# Patient Record
Sex: Female | Born: 1965
Health system: Southern US, Community
[De-identification: ages and names within clinical notes are randomized; demographics above are authoritative.]

## PROBLEM LIST (undated history)

## (undated) ENCOUNTER — Ambulatory Visit (HOSPITAL_COMMUNITY): Admission: EM | Payer: Self-pay | Source: Home / Self Care

## (undated) DIAGNOSIS — D649 Anemia, unspecified: Secondary | ICD-10-CM

## (undated) DIAGNOSIS — K219 Gastro-esophageal reflux disease without esophagitis: Secondary | ICD-10-CM

## (undated) HISTORY — DX: Anemia, unspecified: D64.9

## (undated) HISTORY — PX: TUBAL LIGATION: SHX77

---

## 1997-06-19 ENCOUNTER — Emergency Department (HOSPITAL_COMMUNITY): Admission: EM | Admit: 1997-06-19 | Discharge: 1997-06-19 | Payer: Self-pay | Admitting: Emergency Medicine

## 1998-11-25 ENCOUNTER — Emergency Department (HOSPITAL_COMMUNITY): Admission: EM | Admit: 1998-11-25 | Discharge: 1998-11-25 | Payer: Self-pay | Admitting: Emergency Medicine

## 2000-01-04 ENCOUNTER — Emergency Department (HOSPITAL_COMMUNITY): Admission: EM | Admit: 2000-01-04 | Discharge: 2000-01-04 | Payer: Self-pay | Admitting: Emergency Medicine

## 2004-04-13 ENCOUNTER — Emergency Department (HOSPITAL_COMMUNITY): Admission: EM | Admit: 2004-04-13 | Discharge: 2004-04-13 | Payer: Self-pay | Admitting: Emergency Medicine

## 2005-11-07 ENCOUNTER — Emergency Department (HOSPITAL_COMMUNITY): Admission: EM | Admit: 2005-11-07 | Discharge: 2005-11-07 | Payer: Self-pay | Admitting: Emergency Medicine

## 2007-01-18 ENCOUNTER — Emergency Department (HOSPITAL_COMMUNITY): Admission: EM | Admit: 2007-01-18 | Discharge: 2007-01-18 | Payer: Self-pay | Admitting: Emergency Medicine

## 2010-10-02 ENCOUNTER — Emergency Department (HOSPITAL_COMMUNITY)
Admission: EM | Admit: 2010-10-02 | Discharge: 2010-10-02 | Disposition: A | Discharge status: Home / Self Care | Payer: Medicaid Other | Attending: Emergency Medicine | Admitting: Emergency Medicine

## 2010-10-02 DIAGNOSIS — F411 Generalized anxiety disorder: Secondary | ICD-10-CM | POA: Insufficient documentation

## 2010-10-02 DIAGNOSIS — R51 Headache: Secondary | ICD-10-CM | POA: Insufficient documentation

## 2010-10-07 LAB — PREGNANCY, URINE: Preg Test, Ur: NEGATIVE

## 2010-10-07 LAB — URINALYSIS, ROUTINE W REFLEX MICROSCOPIC
Glucose, UA: NEGATIVE
Ketones, ur: NEGATIVE
Nitrite: NEGATIVE
Protein, ur: NEGATIVE
Urobilinogen, UA: 1

## 2010-10-07 LAB — GC/CHLAMYDIA PROBE AMP, GENITAL
Chlamydia, DNA Probe: NEGATIVE
GC Probe Amp, Genital: NEGATIVE

## 2010-10-07 LAB — URINE MICROSCOPIC-ADD ON

## 2010-10-07 LAB — WET PREP, GENITAL: Trich, Wet Prep: NONE SEEN

## 2010-10-07 LAB — RPR: RPR Ser Ql: NONREACTIVE

## 2012-10-24 ENCOUNTER — Emergency Department (HOSPITAL_COMMUNITY)
Admission: EM | Admit: 2012-10-24 | Discharge: 2012-10-24 | Disposition: A | Discharge status: Home / Self Care | Payer: Medicaid Other | Attending: Emergency Medicine | Admitting: Emergency Medicine

## 2012-10-24 ENCOUNTER — Encounter (HOSPITAL_COMMUNITY): Payer: Self-pay | Admitting: Emergency Medicine

## 2012-10-24 DIAGNOSIS — H9209 Otalgia, unspecified ear: Secondary | ICD-10-CM | POA: Insufficient documentation

## 2012-10-24 DIAGNOSIS — H60399 Other infective otitis externa, unspecified ear: Secondary | ICD-10-CM | POA: Insufficient documentation

## 2012-10-24 DIAGNOSIS — K089 Disorder of teeth and supporting structures, unspecified: Secondary | ICD-10-CM | POA: Insufficient documentation

## 2012-10-24 DIAGNOSIS — H6092 Unspecified otitis externa, left ear: Secondary | ICD-10-CM

## 2012-10-24 MED ORDER — ANTIPYRINE-BENZOCAINE 5.4-1.4 % OT SOLN
3.0000 [drp] | Freq: Once | OTIC | Status: AC
  Administered 2012-10-24: 4 [drp] via OTIC
  Filled 2012-10-24: qty 10

## 2012-10-24 MED ORDER — OFLOXACIN 0.3 % OT SOLN: 3.0000 [drp] | Freq: Two times a day (BID) | OTIC | Status: AC

## 2012-10-24 NOTE — ED Provider Notes (Signed)
CSN: [id number]     Arrival date & time 10/24/12  1332 History  This chart was scribed for non-physician practitioner Lindsey Bellman, PA-C working with Derwood Kaplan, MD by Valera Castle, ED scribe. This patient was seen in room WTR5/WTR5 and the patient's care was started at 1:57 PM.    Chief Complaint  Patient presents with  . Otalgia    The history is provided by the patient. No language interpreter was used.   HPI Comments: Lindsey Simpson is a 47 y.o. female brought in by EMS who presents to the Emergency Department complaining of gradual, mild, constant left ear pain, with a severity of 2/10, onset 2 months ago. She states that the pain has been increasing since onset, but denies any drainage from her ear. She reports the pain radiates into her left upper gum and she is concerned she is having dental pain. She reports taking Ibuprofen for the symptoms, with no relief. She denies swimming recently. She denies fever, SOB, rhinorrhea, congestion, sore throat, nausea, and any other associated symptoms. She reports occasional EtOH use, but denies smoking. She has no known allergies, and denies any prior medical history.    History reviewed. No pertinent past medical history. History reviewed. No pertinent past surgical history. History reviewed. No pertinent family history. History  Substance Use Topics  . Smoking status: Never Smoker   . Smokeless tobacco: Not on file  . Alcohol Use: Yes   OB History   Grav Para Term Preterm Abortions TAB SAB Ect Mult Living                 Review of Systems  Constitutional: Negative for fever.  HENT: Positive for dental problem (Left sided dental pain.) and ear pain (Left ear pain.). Negative for congestion, rhinorrhea and sore throat.   Respiratory: Negative for shortness of breath.   Gastrointestinal: Negative for nausea.  All other systems reviewed and are negative.    Allergies  Review of patient's allergies indicates no known  allergies.  Home Medications   Current Outpatient Rx  Name  Route  Sig  Dispense  Refill  . Aspirin-Acetaminophen-Caffeine (EXCEDRIN PO)   Oral   Take 2 tablets by mouth daily as needed (pain).         Marland Kitchen ibuprofen (ADVIL,MOTRIN) 200 MG tablet   Oral   Take 400 mg by mouth every 6 (six) hours as needed for pain.          Triage Vitals: BP 112/63  Pulse 79  Temp(Src) 98.2 F (36.8 C) (Oral)  Resp 12  SpO2 100%   Physical Exam  Nursing note and vitals reviewed. Constitutional: She is oriented to person, place, and time. She appears well-developed and well-nourished. No distress.  HENT:  Head: Normocephalic and atraumatic.  Right Ear: Tympanic membrane, external ear and ear canal normal. No mastoid tenderness.  Left Ear: Tympanic membrane normal. There is tenderness (tragal tenderness). No mastoid tenderness.  Nose: Nose normal.  Mouth/Throat: Oropharynx is clear and moist.  Mild edema of left ear canal. No TM injection. No trismus, submental edema, or tongue elevation. No tenderness to gum line.   Eyes: Conjunctivae are normal.  Neck: Normal range of motion.  Cardiovascular: Normal rate, regular rhythm and normal heart sounds.   Pulmonary/Chest: Effort normal and breath sounds normal. No stridor. No respiratory distress. She has no wheezes. She has no rales.  Abdominal: Soft. She exhibits no distension.  Musculoskeletal: Normal range of motion.  Neurological: She is  alert and oriented to person, place, and time. She has normal strength.  Skin: Skin is warm and dry. She is not diaphoretic. No erythema.  Psychiatric: She has a normal mood and affect. Her behavior is normal.    ED Course  Procedures (including critical care time)  DIAGNOSTIC STUDIES: Oxygen Saturation is 100% on room air, normal by my interpretation.    COORDINATION OF CARE: 2:00 PM-Discussed treatment plan which includes Auralgan with pt at bedside and pt agreed to plan. Advised pt of clinical  suspicion of external ear infection.     Labs Review Labs Reviewed - No data to display Imaging Review No results found.  MDM   1. Otitis externa, left    Pt presenting with otitis externa. No canal occlusion, Pt afebrile in NAD. Exam non concerning for mastoiditis, cellulitis or malignant OE. Dc with ofloxacin script.  Advised PCP follow up in 2-3 days if no improvement with treatment or no complete resolution by 7 days. Earlier f-u if pt develops rash , allergic reaction to medication, or loss of hearing.   I personally performed the services described in this documentation, which was scribed in my presence. The recorded information has been reviewed and is accurate.    Lindsey Bellman, PA-C 10/24/12 1826

## 2012-10-24 NOTE — ED Notes (Signed)
Per EMS, Pt c/o L ear pain x 2 months. Pain score 2/10.  Vitals are stable.

## 2012-10-29 NOTE — ED Provider Notes (Signed)
Medical screening examination/treatment/procedure(s) were performed by non-physician practitioner and as supervising physician I was immediately available for consultation/collaboration.  Manan Olmo, MD 10/29/12 0012 

## 2012-12-16 ENCOUNTER — Encounter (HOSPITAL_COMMUNITY): Payer: Self-pay | Admitting: Emergency Medicine

## 2012-12-16 ENCOUNTER — Emergency Department (HOSPITAL_COMMUNITY)
Admission: EM | Admit: 2012-12-16 | Discharge: 2012-12-16 | Disposition: A | Discharge status: Home / Self Care | Payer: Medicaid Other | Attending: Emergency Medicine | Admitting: Emergency Medicine

## 2012-12-16 DIAGNOSIS — H60399 Other infective otitis externa, unspecified ear: Secondary | ICD-10-CM | POA: Insufficient documentation

## 2012-12-16 DIAGNOSIS — H6092 Unspecified otitis externa, left ear: Secondary | ICD-10-CM

## 2012-12-16 DIAGNOSIS — R51 Headache: Secondary | ICD-10-CM | POA: Insufficient documentation

## 2012-12-16 MED ORDER — HYDROCORTISONE-ACETIC ACID 1-2 % OT SOLN: 4.0000 [drp] | Freq: Three times a day (TID) | OTIC | Status: DC

## 2012-12-16 MED ORDER — AMOXICILLIN 500 MG PO CAPS: 500.0000 mg | ORAL_CAPSULE | Freq: Three times a day (TID) | ORAL | Status: DC

## 2012-12-16 NOTE — ED Notes (Signed)
Pt from home reports that she was dx with swimmer's ear several months ago. Pt states that her L earache has gotten worse and it feels like her "brain is hurting" over her L eye and L temple. Pt denies N/V/D, fever, cough. Pt is A&O and in NAD

## 2012-12-16 NOTE — ED Notes (Signed)
Pt reports left ear pain x 2-3 months. Reports her "brain started hurting 1 month ago". Pain 9/10. Was seen in ED 10/8, given prescriptions, pt reports she lost prescriptions. Pt worried ear infection has spread to her brain.

## 2012-12-19 NOTE — ED Provider Notes (Signed)
CSN: [id number]     Arrival date & time 12/16/12  1322 History   First MD Initiated Contact with Patient 12/16/12 1337     Chief Complaint  Patient presents with  . Otalgia  . Headache   (Consider location/radiation/quality/duration/timing/severity/associated sxs/prior Treatment) HPI  47 year old female with left ear pain. Onset a couple months ago. Symptoms have waxed and waned but worsened within the past several days. She was evaluated about a month ago the emergency room for similar complaints. She denies fill her prescriptions. Returning today because she feels like infection may have spread to her brain. She does have a mild left-sided headache. No fevers or chills. No neck pain or neck stiffness. Has been taking NSAIDs occasionally with some mild relief. No history of diabetes.  History reviewed. No pertinent past medical history. History reviewed. No pertinent past surgical history. History reviewed. No pertinent family history. History  Substance Use Topics  . Smoking status: Never Smoker   . Smokeless tobacco: Not on file  . Alcohol Use: Yes   OB History   Grav Para Term Preterm Abortions TAB SAB Ect Mult Living                 Review of Systems  All systems reviewed and negative, other than as noted in HPI.   Allergies  Review of patient's allergies indicates no known allergies.  Home Medications   Current Outpatient Rx  Name  Route  Sig  Dispense  Refill  . ibuprofen (ADVIL,MOTRIN) 200 MG tablet   Oral   Take 800 mg by mouth every 6 (six) hours as needed for pain.          Marland Kitchen acetic acid-hydrocortisone (VOSOL-HC) otic solution   Left Ear   Place 4 drops into the left ear 3 (three) times daily.   10 mL   0   . amoxicillin (AMOXIL) 500 MG capsule   Oral   Take 1 capsule (500 mg total) by mouth 3 (three) times daily.   21 capsule   0    BP 102/71  Pulse 70  Temp(Src) 98.3 F (36.8 C) (Oral)  Resp 16  SpO2 99%  LMP 11/15/2012 Physical Exam   Nursing note and vitals reviewed. Constitutional: She appears well-developed and well-nourished. No distress.  HENT:  Head: Normocephalic and atraumatic.  Left ear pain with manipulation of the pinna. External auditory canal is somewhat edematous and inferior aspect looks raw. Thick milky discharge. No mastoid tenderness or overlying skin changes. No ear proptosis. Neck is supple. No nodes.  Eyes: Conjunctivae are normal. Right eye exhibits no discharge. Left eye exhibits no discharge.  Neck: Neck supple.  Cardiovascular: Normal rate, regular rhythm and normal heart sounds.  Exam reveals no gallop and no friction rub.   No murmur heard. Pulmonary/Chest: Effort normal and breath sounds normal. No respiratory distress.  Abdominal: Soft. She exhibits no distension. There is no tenderness.  Musculoskeletal: She exhibits no edema and no tenderness.  Neurological: She is alert.  Skin: Skin is warm and dry.  Psychiatric: She has a normal mood and affect. Her behavior is normal. Thought content normal.    ED Course  Procedures (including critical care time) Labs Review Labs Reviewed - No data to display Imaging Review No results found.  EKG Interpretation   None       MDM   1. Otitis externa, left    47 year old female with left ear pain. Patient is exam is consistent with otitis externa. She has  a lot of goop in he external auditory canal making it difficult to visualize her TM. She does not tolerate attempts to remove this very well. Patient was again provided with prescriptions for antibiotics. She should be able to afford these. She cannot afford the otic drops, discussed that she can use vinegar instead. Needs to establish a PCP. Return precautions were discussed. This appears to be a localized infection and no clinical evidence of mastoiditis or systemic illness.   Raeford Razor, MD 12/19/12 1816

## 2013-01-11 ENCOUNTER — Encounter (HOSPITAL_COMMUNITY): Payer: Self-pay | Admitting: Emergency Medicine

## 2013-01-11 ENCOUNTER — Emergency Department (HOSPITAL_COMMUNITY)
Admission: EM | Admit: 2013-01-11 | Discharge: 2013-01-12 | Disposition: A | Discharge status: Home / Self Care | Payer: Self-pay | Attending: Emergency Medicine | Admitting: Emergency Medicine

## 2013-01-11 DIAGNOSIS — H9209 Otalgia, unspecified ear: Secondary | ICD-10-CM | POA: Insufficient documentation

## 2013-01-11 DIAGNOSIS — H9202 Otalgia, left ear: Secondary | ICD-10-CM

## 2013-01-11 MED ORDER — MINERAL OIL PO OIL
TOPICAL_OIL | Freq: Once | ORAL | Status: DC
  Filled 2013-01-11: qty 30

## 2013-01-11 MED ORDER — ANTIPYRINE-BENZOCAINE 5.4-1.4 % OT SOLN
3.0000 [drp] | Freq: Once | OTIC | Status: AC
  Administered 2013-01-12: 4 [drp] via OTIC
  Filled 2013-01-11: qty 10

## 2013-01-11 MED ORDER — AMOXICILLIN 500 MG PO CAPS: 500.0000 mg | ORAL_CAPSULE | Freq: Three times a day (TID) | ORAL | Status: DC

## 2013-01-11 NOTE — ED Provider Notes (Signed)
CSN: [id number]     Arrival date & time 01/11/13  1513 History  This chart was scribed for non-physician practitioner Irish Elders, NP, working with Audree Camel, MD by Dorothey Baseman, ED Scribe. This patient was seen in room TR05C/TR05C and the patient's care was started at 7:55 PM.    Chief Complaint  Patient presents with  . Otalgia   The history is provided by the patient. No language interpreter was used.   HPI Comments: Lindsey Simpson is a 47 y.o. female who presents to the Emergency Department complaining of an intermittent pain to left ear that has been ongoing for the past few months. Patient was seen here on 10/24/2012 and 12/16/2012 for similar complaints and was discharged with antibiotics to treat otitis externa, which the patient did not have filled (as per the provider's note from 11/30). She denies fever, chills, congestion. Patient has no other pertinent medical history.   History reviewed. No pertinent past medical history. History reviewed. No pertinent past surgical history. History reviewed. No pertinent family history. History  Substance Use Topics  . Smoking status: Never Smoker   . Smokeless tobacco: Not on file  . Alcohol Use: Yes   OB History   Grav Para Term Preterm Abortions TAB SAB Ect Mult Living                 Review of Systems  Constitutional: Negative for fever and chills.  HENT: Positive for ear pain. Negative for congestion.   All other systems reviewed and are negative.    Allergies  Review of patient's allergies indicates no known allergies.  Home Medications   Current Outpatient Rx  Name  Route  Sig  Dispense  Refill  . ibuprofen (ADVIL,MOTRIN) 200 MG tablet   Oral   Take 800 mg by mouth every 6 (six) hours as needed for pain.          Marland Kitchen amoxicillin (AMOXIL) 500 MG capsule   Oral   Take 1 capsule (500 mg total) by mouth 3 (three) times daily.   21 capsule   0    Triage Vitals: BP 118/55  Pulse 80  Temp(Src) 98.5 F  (36.9 C) (Oral)  Resp 18  Ht 5\' 10"  (1.778 m)  Wt 132 lb (59.875 kg)  BMI 18.94 kg/m2  SpO2 100%  LMP 11/15/2012  Physical Exam  Nursing note and vitals reviewed. Constitutional: She is oriented to person, place, and time. She appears well-developed and well-nourished. No distress.  HENT:  Head: Normocephalic and atraumatic.  Right Ear: Hearing, tympanic membrane, external ear and ear canal normal.  Left Ear: Hearing normal.  Eyes: Conjunctivae are normal.  Neck: Normal range of motion. Neck supple.  Pulmonary/Chest: Effort normal. No respiratory distress.  Abdominal: She exhibits no distension.  Musculoskeletal: Normal range of motion.  Neurological: She is alert and oriented to person, place, and time.  Skin: Skin is warm and dry.  Psychiatric: She has a normal mood and affect. Her behavior is normal.    ED Course  Procedures (including critical care time)  DIAGNOSTIC STUDIES: Oxygen Saturation is 100% on room air, normal by my interpretation.    COORDINATION OF CARE: 7:57 PM- Will order liquid mineral oil to irrigate the ear. Discussed treatment plan with patient at bedside and patient verbalized agreement.   10:20 PM- Performed an irrigation of the ears. Both ears still appeared impacted. Will order Auralgan and discharge patient with antibiotics. Discussed treatment plan with patient at bedside and  patient verbalized agreement.     Labs Review Labs Reviewed - No data to display Imaging Review No results found.  EKG Interpretation   None       MDM   1. Ear pain, left    Afebrile. History of OM. Prescription for Amoxicillin given. Return precautions discussed. Take ibuprofen for discomfort. Pts ears irrigated, bilaterally due to wax impaction. Pt reports feeling better after ear wash and auralgan gtts.  I personally performed the services described in this documentation, which was scribed in my presence. The recorded information has been reviewed and is  accurate.       Irish Elders, NP 01/26/13 1520

## 2013-01-11 NOTE — ED Notes (Signed)
Pt here from with c/o left ear pain , pt was seen about 1 month ago for same problem ,

## 2013-01-29 NOTE — ED Provider Notes (Signed)
Medical screening examination/treatment/procedure(s) were performed by non-physician practitioner and as supervising physician I was immediately available for consultation/collaboration.  EKG Interpretation   None         Drae Mitzel T Demarcus Thielke, MD 01/29/13 0050 

## 2013-12-22 ENCOUNTER — Observation Stay (HOSPITAL_COMMUNITY): Payer: MEDICAID

## 2013-12-22 ENCOUNTER — Encounter (HOSPITAL_COMMUNITY): Payer: Self-pay | Admitting: Emergency Medicine

## 2013-12-22 ENCOUNTER — Observation Stay (HOSPITAL_COMMUNITY)
Admission: EM | Admit: 2013-12-22 | Discharge: 2013-12-23 | Disposition: A | Discharge status: Home / Self Care | Payer: Self-pay | Attending: Internal Medicine | Admitting: Internal Medicine

## 2013-12-22 DIAGNOSIS — N390 Urinary tract infection, site not specified: Secondary | ICD-10-CM | POA: Insufficient documentation

## 2013-12-22 DIAGNOSIS — N939 Abnormal uterine and vaginal bleeding, unspecified: Secondary | ICD-10-CM | POA: Diagnosis present

## 2013-12-22 DIAGNOSIS — N920 Excessive and frequent menstruation with regular cycle: Principal | ICD-10-CM | POA: Insufficient documentation

## 2013-12-22 DIAGNOSIS — T8351XA Infection and inflammatory reaction due to indwelling urinary catheter, initial encounter: Secondary | ICD-10-CM

## 2013-12-22 DIAGNOSIS — N921 Excessive and frequent menstruation with irregular cycle: Secondary | ICD-10-CM

## 2013-12-22 DIAGNOSIS — I959 Hypotension, unspecified: Secondary | ICD-10-CM | POA: Insufficient documentation

## 2013-12-22 DIAGNOSIS — D649 Anemia, unspecified: Secondary | ICD-10-CM | POA: Diagnosis present

## 2013-12-22 DIAGNOSIS — D500 Iron deficiency anemia secondary to blood loss (chronic): Secondary | ICD-10-CM | POA: Insufficient documentation

## 2013-12-22 LAB — CBC
HEMATOCRIT: 20.8 % — AB (ref 36.0–46.0)
Hemoglobin: 5.3 g/dL — CL (ref 12.0–15.0)
MCH: 15.6 pg — ABNORMAL LOW (ref 26.0–34.0)
MCHC: 25.5 g/dL — ABNORMAL LOW (ref 30.0–36.0)
MCV: 61.4 fL — AB (ref 78.0–100.0)
Platelets: 395 10*3/uL (ref 150–400)
RBC: 3.39 MIL/uL — AB (ref 3.87–5.11)
RDW: 19.1 % — ABNORMAL HIGH (ref 11.5–15.5)
WBC: 4.4 10*3/uL (ref 4.0–10.5)

## 2013-12-22 LAB — URINE MICROSCOPIC-ADD ON

## 2013-12-22 LAB — HIV ANTIBODY (ROUTINE TESTING W REFLEX): HIV 1&2 Ab, 4th Generation: NONREACTIVE

## 2013-12-22 LAB — URINALYSIS, ROUTINE W REFLEX MICROSCOPIC
BILIRUBIN URINE: NEGATIVE
Glucose, UA: NEGATIVE mg/dL
Ketones, ur: 15 mg/dL — AB
NITRITE: POSITIVE — AB
PH: 6 (ref 5.0–8.0)
Protein, ur: 30 mg/dL — AB
SPECIFIC GRAVITY, URINE: 1.019 (ref 1.005–1.030)
Urobilinogen, UA: 1 mg/dL (ref 0.0–1.0)

## 2013-12-22 LAB — BASIC METABOLIC PANEL
Anion gap: 11 (ref 5–15)
BUN: 17 mg/dL (ref 6–23)
CALCIUM: 8.7 mg/dL (ref 8.4–10.5)
CO2: 23 mEq/L (ref 19–32)
CREATININE: 0.7 mg/dL (ref 0.50–1.10)
Chloride: 103 mEq/L (ref 96–112)
GFR calc Af Amer: >90 mL/min (ref >90)
GLUCOSE: 85 mg/dL (ref 70–99)
Potassium: 3.8 mEq/L (ref 3.7–5.3)
SODIUM: 137 meq/L (ref 137–147)

## 2013-12-22 LAB — RPR

## 2013-12-22 LAB — HEMOGLOBIN AND HEMATOCRIT, BLOOD
HCT: 27.1 % — ABNORMAL LOW (ref 36.0–46.0)
Hemoglobin: 7.8 g/dL — ABNORMAL LOW (ref 12.0–15.0)

## 2013-12-22 LAB — TSH: TSH: 1.14 u[IU]/mL (ref 0.350–4.500)

## 2013-12-22 LAB — PROTIME-INR
INR: 1.08 (ref 0.00–1.49)
PROTHROMBIN TIME: 14.1 s (ref 11.6–15.2)

## 2013-12-22 LAB — WET PREP, GENITAL
Clue Cells Wet Prep HPF POC: NONE SEEN
TRICH WET PREP: NONE SEEN
YEAST WET PREP: NONE SEEN

## 2013-12-22 LAB — RETICULOCYTES
RBC.: 3.95 MIL/uL (ref 3.87–5.11)
Retic Count, Absolute: 15.8 10*3/uL — ABNORMAL LOW (ref 19.0–186.0)
Retic Ct Pct: 0.4 % (ref 0.4–3.1)

## 2013-12-22 LAB — ABO/RH: ABO/RH(D): O NEG

## 2013-12-22 LAB — POC URINE PREG, ED: Preg Test, Ur: NEGATIVE

## 2013-12-22 LAB — PREPARE RBC (CROSSMATCH)

## 2013-12-22 MED ORDER — FERROUS SULFATE 325 (65 FE) MG PO TABS
325.0000 mg | ORAL_TABLET | Freq: Two times a day (BID) | ORAL | Status: DC
  Administered 2013-12-22 – 2013-12-23 (×2): 325 mg via ORAL
  Filled 2013-12-22 (×5): qty 1

## 2013-12-22 MED ORDER — ACETAMINOPHEN 325 MG PO TABS
650.0000 mg | ORAL_TABLET | Freq: Four times a day (QID) | ORAL | Status: DC | PRN
  Administered 2013-12-23: 650 mg via ORAL
  Filled 2013-12-22: qty 2

## 2013-12-22 MED ORDER — ONDANSETRON HCL 4 MG/2ML IJ SOLN: 4.0000 mg | Freq: Four times a day (QID) | INTRAMUSCULAR | Status: DC | PRN

## 2013-12-22 MED ORDER — ESTROGENS CONJUGATED 25 MG IJ SOLR
25.0000 mg | Freq: Four times a day (QID) | INTRAMUSCULAR | Status: AC
  Administered 2013-12-22 (×2): 25 mg via INTRAVENOUS
  Filled 2013-12-22 (×3): qty 25

## 2013-12-22 MED ORDER — MEGESTROL ACETATE 40 MG PO TABS
120.0000 mg | ORAL_TABLET | Freq: Every day | ORAL | Status: DC
  Administered 2013-12-22 – 2013-12-23 (×2): 120 mg via ORAL
  Filled 2013-12-22 (×2): qty 3

## 2013-12-22 MED ORDER — DEXTROSE 5 % IV SOLN
1.0000 g | INTRAVENOUS | Status: DC
  Administered 2013-12-22: 1 g via INTRAVENOUS
  Filled 2013-12-22 (×2): qty 10

## 2013-12-22 MED ORDER — ONDANSETRON HCL 4 MG PO TABS: 4.0000 mg | ORAL_TABLET | Freq: Four times a day (QID) | ORAL | Status: DC | PRN

## 2013-12-22 MED ORDER — SODIUM CHLORIDE 0.9 % IJ SOLN
3.0000 mL | Freq: Two times a day (BID) | INTRAMUSCULAR | Status: DC
Start: 2013-12-22 — End: 2013-12-23
  Administered 2013-12-22: 3 mL via INTRAVENOUS

## 2013-12-22 MED ORDER — ACETAMINOPHEN 650 MG RE SUPP: 650.0000 mg | Freq: Four times a day (QID) | RECTAL | Status: DC | PRN

## 2013-12-22 MED ORDER — SODIUM CHLORIDE 0.9 % IV SOLN
Freq: Once | INTRAVENOUS | Status: AC
  Administered 2013-12-22: 11:00:00 via INTRAVENOUS

## 2013-12-22 MED ORDER — SODIUM CHLORIDE 0.9 % IV SOLN
1020.0000 mg | Freq: Once | INTRAVENOUS | Status: AC
  Administered 2013-12-22: 1020 mg via INTRAVENOUS
  Filled 2013-12-22: qty 34

## 2013-12-22 NOTE — ED Notes (Signed)
Critical lab of [facility] 5.3 per Jordan Hawks.

## 2013-12-22 NOTE — ED Notes (Signed)
Admitting MD at bedside.

## 2013-12-22 NOTE — ED Notes (Signed)
EMS - Patient coming from home with excessive vaginal bleeding since 12/10/13.  Per EMS the bleeding is so constant to the point "she needs one of her grand kids pampers".  Feels very fatigued and minor abdominal pain.

## 2013-12-22 NOTE — H&P (Signed)
History and Physical       Hospital Admission Note Date: 12/22/2013  Patient name: Lindsey Simpson Medical record number: [id number] Date of birth: December 26, 1965 Age: 48 y.o. Gender: female  PCP: No PCP Per Patient    Chief Complaint:  Heavy Vaginal bleeding with dizziness  HPI: Patient is a 48 year old female with no significant past medical history presented to ED with dizziness, lightheadedness, heavy vaginal bleeding. History was obtained from the patient, has not seen any PCP or OB/GYN in more than 10 years. Patient reported that she had normal menstrual period, last in 7 days in the  first week of November. Subsequently she restarted another menstrual period on November 24 which has been still going on, heavy bleeding. Patient also reports that she has been unable to afford sanitary napkins. She felt very weak, fatigued, dizzy and lightheaded. She also felt cold intolerance. Denies any dysuria, abdominal pain, fevers or chills. In the ED, hemoglobin found to be 5.3 patient was admitted for symptomatic anemia requiring blood transfusion.    Review of Systems:  Constitutional: Denies fever, chills, diaphoresis, poor appetite and + fatigue.  HEENT: Denies photophobia, eye pain, redness, hearing loss, ear pain, congestion, sore throat, rhinorrhea, sneezing, mouth sores, trouble swallowing, neck pain, neck stiffness and tinnitus.   Respiratory: Denies SOB, DOE, cough, chest tightness,  and wheezing.   Cardiovascular: Denies chest pain, palpitations and leg swelling.  Gastrointestinal: Denies nausea, vomiting, abdominal pain, diarrhea, constipation, blood in stool and abdominal distention.  Genitourinary: Denies dysuria, urgency, frequency, hematuria, flank pain and difficulty urinating.  Please see history of present illness Musculoskeletal: Denies myalgias, back pain, joint swelling, arthralgias and gait problem.  Skin: Denies pallor,  rash and wound.  Neurological: Denies seizures, syncope, numbness and headaches.  dizziness with lightheadedness + Hematological: Denies adenopathy. Easy bruising, personal or family bleeding history Psychiatric/Behavioral: Denies suicidal ideation, mood changes, confusion, nervousness, sleep disturbance and agitation  Past Medical History: History reviewed. No pertinent past medical history. Past Surgical History  Procedure Laterality Date  . Tubal ligation      Medications: Prior to Admission medications   Medication Sig Start Date End Date Taking? Authorizing Provider  ibuprofen (ADVIL,MOTRIN) 200 MG tablet Take 800 mg by mouth every 6 (six) hours as needed for pain.    Yes Historical Provider, MD  amoxicillin (AMOXIL) 500 MG capsule Take 1 capsule (500 mg total) by mouth 3 (three) times daily. Patient not taking: Reported on 12/22/2013 01/11/13   Elisha Headland, NP    Allergies:  No Known Allergies  Social History:  reports that she has never smoked. She does not have any smokeless tobacco history on file. She reports that she drinks alcohol. She reports that she does not use illicit drugs.  Family History: No family history on file.  Physical Exam: Blood pressure 109/62, pulse 61, temperature 98.5 F (36.9 C), temperature source Oral, resp. rate 16, height 5\' 11"  (1.803 m), weight 61.236 kg (135 lb), SpO2 100 %. General: Alert, awake, oriented x3, in no acute distress, pale conjunctiva . HEENT: normocephalic, atraumatic, anicteric sclera, pink conjunctiva, pupils equal and reactive to light and accomodation, oropharynx clear Neck: supple, no masses or lymphadenopathy, no goiter, no bruits  Heart: Regular rate and rhythm, without murmurs, rubs or gallops. Lungs: Clear to auscultation bilaterally, no wheezing, rales or rhonchi. Abdomen: Soft, nontender, nondistended, positive bowel sounds, no masses. Extremities: No clubbing, cyanosis or edema with positive pedal pulses. Neuro:  Grossly intact, no focal neurological deficits, strength 5/5  upper and lower extremities bilaterally Psych: alert and oriented x 3, normal mood and affect Skin: no rashes or lesions, warm and dry   LABS on Admission:  Basic Metabolic Panel:  Recent Labs Lab 12/22/13 1105  NA 137  K 3.8  CL 103  CO2 23  GLUCOSE 85  BUN 17  CREATININE 0.70  CALCIUM 8.7   Liver Function Tests: No results for input(s): AST, ALT, ALKPHOS, BILITOT, PROT, ALBUMIN in the last 168 hours. No results for input(s): LIPASE, AMYLASE in the last 168 hours. No results for input(s): AMMONIA in the last 168 hours. CBC:  Recent Labs Lab 12/22/13 0936  WBC 4.4  HGB 5.3*  HCT 20.8*  MCV 61.4*  PLT 395   Cardiac Enzymes: No results for input(s): CKTOTAL, CKMB, CKMBINDEX, TROPONINI in the last 168 hours. BNP: Invalid input(s): POCBNP CBG: No results for input(s): GLUCAP in the last 168 hours.   Radiological Exams on Admission: No results found.  Assessment/Plan Principal Problem:   Symptomatic anemia secondary to menorrhagia, microcytic anemia MCV 61.4 - Patient will be admitted for symptomatic anemia, transfuse 2 units packed RBC, recheck h/h after second transfusion, may need more transfusions - Patient also started on IV Ferriheme. EDP discussed with OB/GYN on call Dr. Darlis Loan, who recommended to start patient on Premarin once now and repeat dose in 6 hours, Megestrol 120 mg daily for 5 days, then taper to 80mg  daily until she is followed up at women's clinic - Pregnancy test negative, obtain pelvic ultrasound to rule out uterine fibroids, anemia panel, TSH - Place on iron supplementation  Active Problems:   Vaginal bleeding As #1     Hypotension - Currently stable - Likely due to #1, continue blood transfusion    UTI (urinary tract infection) - Obtain urine culture and sensitivities, placed on IV Rocephin   DVT prophylaxis:  SCDs   CODE STATUS:  full code  Family Communication:  Admission, patients condition and plan of care including tests being ordered have been discussed with the patient who indicates understanding and agree with the plan and Code Status   Further plan will depend as patient's clinical course evolves and further radiologic and laboratory data become available.   Time Spent on Admission: 1 hour  RAI,RIPUDEEP M.D. [facility] 12/22/2013, 1:19 PM Pager: [phone]  If 7PM-7AM, please contact night-coverage [email or web address] Password [email or web address]

## 2013-12-22 NOTE — Progress Notes (Signed)
MD notified that patient has a hgb of 7.8 after 2 units PRBCs transfused. No new orders to transfuse patient.  Will continue to monitor patient and see what CBC results come back in am.

## 2013-12-22 NOTE — Plan of Care (Signed)
Problem: Consults Goal: Skin Care Protocol Initiated - if Braden Score 18 or less If consults are not indicated, leave blank or document N/A  Outcome: Not Applicable Date Met:  33/00/76 Goal: Nutrition Consult-if indicated Outcome: Not Applicable Date Met:  22/63/33 Goal: Diabetes Guidelines if Diabetic/Glucose > 140 If diabetic or lab glucose is > 140 mg/dl - Initiate Diabetes/Hyperglycemia Guidelines & Document Interventions  Outcome: Not Applicable Date Met:  54/56/25  Problem: Phase I Progression Outcomes Goal: OOB as tolerated unless otherwise ordered Outcome: Completed/Met Date Met:  12/22/13 Goal: Initial discharge plan identified Outcome: Completed/Met Date Met:  12/22/13 Goal: Voiding-avoid urinary catheter unless indicated Outcome: Completed/Met Date Met:  12/22/13 Goal: Hemodynamically stable Outcome: Completed/Met Date Met:  12/22/13

## 2013-12-22 NOTE — ED Notes (Signed)
PA Harris at bedside for assessment.

## 2013-12-22 NOTE — ED Provider Notes (Signed)
CSN: [id number]     Arrival date & time 12/22/13  8921 History   First MD Initiated Contact with Patient 12/22/13 [phone]     Chief Complaint  Patient presents with  . Menorrhagia     (Consider location/radiation/quality/duration/timing/severity/associated sxs/prior Treatment) HPI  Lindsey Simpson Is a 48 year old African-American female who presents to emergency department with chief complaint of vaginal bleeding. She has a history of heavy menstrual cycles. She has no outpatient PCP and has not seen an OB/GYN specialist since she was pregnant, 18 years ago. The patient states that she had a normal period which lasted about 7 days at the beginning of November. Approximately one week later she had another full. Then beginning 12/10/2013 she began having her period again with very heavy bleeding. The patient states that she has been unable to afford sanitary napkins and has been using paper products and sometimes her grandchild's diaper. Patient states that she is bleeding heavily through all the products. She is using frequent. He has clinic change her clothes. She feels dizzy, lightheaded and weak. She endorses cold intolerance. She denies urinary symptoms or abdominal pain. She denies foul odor or vaginal discharge.  History reviewed. No pertinent past medical history. Past Surgical History  Procedure Laterality Date  . Tubal ligation     No family history on file. History  Substance Use Topics  . Smoking status: Never Smoker   . Smokeless tobacco: Not on file  . Alcohol Use: Yes     Comment: occassional   OB History    No data available     Review of Systems  Ten systems reviewed and are negative for acute change, except as noted in the HPI.    Allergies  Review of patient's allergies indicates no known allergies.  Home Medications   Prior to Admission medications   Medication Sig Start Date End Date Taking? Authorizing Provider  ibuprofen (ADVIL,MOTRIN) 200 MG tablet  Take 800 mg by mouth every 6 (six) hours as needed for pain.    Yes Historical Provider, MD  amoxicillin (AMOXIL) 500 MG capsule Take 1 capsule (500 mg total) by mouth 3 (three) times daily. Patient not taking: Reported on 12/22/2013 01/11/13   Elisha Headland, NP  ciprofloxacin (CIPRO) 500 MG tablet Take 1 tablet (500 mg total) by mouth 2 (two) times daily. 12/23/13   Bobby Rumpf York, PA-C  docusate sodium (COLACE) 100 MG capsule Take 1 capsule (100 mg total) by mouth 2 (two) times daily. 12/23/13   Melton Alar, PA-C  ferrous sulfate 325 (65 FE) MG tablet Take 1 tablet (325 mg total) by mouth 2 (two) times daily with a meal. 12/23/13   Melton Alar, PA-C  megestrol (MEGACE) 40 MG tablet Take 3 tabs daily for 3 days.  Then take 2 tabs daily until you see the gynecologist at [facility]. 12/23/13   Bobby Rumpf York, PA-C   BP 114/70 mmHg  Pulse 58  Temp(Src) 98.2 F (36.8 C) (Oral)  Resp 18  Ht 5\' 11"  (1.803 m)  Wt 135 lb (61.236 kg)  BMI 18.84 kg/m2  SpO2 100%  LMP  Physical Exam  Constitutional: She is oriented to person, place, and time. No distress.  Very thin, pale African-American female in no acute distress  HENT:  Head: Normocephalic and atraumatic.  Eyes: Conjunctivae are normal. No scleral icterus.  Neck: Normal range of motion.  Cardiovascular: Normal rate, regular rhythm and normal heart sounds.  Exam reveals no gallop and no friction  rub.   No murmur heard. Pulmonary/Chest: Effort normal and breath sounds normal. No respiratory distress.  Abdominal: Soft. Bowel sounds are normal. She exhibits no distension and no mass. There is no tenderness. There is no guarding.  Genitourinary:  Pelvic examination performed by myself. Patient has normal and vagina. Suction was utilized to remove copious bleeding. No clots present. I was able to visualize the cervix. Os is no lip breast and there are multiple nabothian cysts present. No cervical motion tenderness, no adnexal tenderness or  fullness. Uterus is palpably enlarged.  Neurological: She is alert and oriented to person, place, and time.  Skin: Skin is warm and dry. She is not diaphoretic.  Nursing note and vitals reviewed.   ED Course  Procedures (including critical care time) Labs Review Labs Reviewed  WET PREP, GENITAL - Abnormal; Notable for the following:    WBC, Wet Prep HPF POC FEW (*)    All other components within normal limits  CBC - Abnormal; Notable for the following:    RBC 3.39 (*)    Hemoglobin 5.3 (*)    HCT 20.8 (*)    MCV 61.4 (*)    MCH 15.6 (*)    MCHC 25.5 (*)    RDW 19.1 (*)    All other components within normal limits  URINALYSIS, ROUTINE W REFLEX MICROSCOPIC - Abnormal; Notable for the following:    Color, Urine BROWN (*)    APPearance TURBID (*)    Hgb urine dipstick LARGE (*)    Ketones, ur 15 (*)    Protein, ur 30 (*)    Nitrite POSITIVE (*)    Leukocytes, UA SMALL (*)    All other components within normal limits  URINE MICROSCOPIC-ADD ON - Abnormal; Notable for the following:    Squamous Epithelial / LPF FEW (*)    Bacteria, UA MANY (*)    All other components within normal limits  FERRITIN - Abnormal; Notable for the following:    Ferritin 5 (*)    All other components within normal limits  RETICULOCYTES - Abnormal; Notable for the following:    Retic Count, Manual 15.8 (*)    All other components within normal limits  CBC - Abnormal; Notable for the following:    Hemoglobin 8.2 (*)    HCT 27.4 (*)    MCV 65.4 (*)    MCH 19.6 (*)    MCHC 29.9 (*)    RDW 22.3 (*)    All other components within normal limits  HEMOGLOBIN AND HEMATOCRIT, BLOOD - Abnormal; Notable for the following:    Hemoglobin 7.8 (*)    HCT 27.1 (*)    All other components within normal limits  GC/CHLAMYDIA PROBE AMP  URINE CULTURE  HIV ANTIBODY (ROUTINE TESTING)  RPR  PROTIME-INR  BASIC METABOLIC PANEL  TSH  VITAMIN B12  FOLATE  BASIC METABOLIC PANEL  IRON AND TIBC  POC URINE PREG, ED   TYPE AND SCREEN  PREPARE RBC (CROSSMATCH)  ABO/RH    Imaging Review No results found.   EKG Interpretation None      MDM   Final diagnoses:  Symptomatic anemia  Menorrhagia with irregular cycle    9:52 AM BP 114/70 mmHg  Pulse 58  Temp(Src) 98.2 F (36.8 C) (Oral)  Resp 18  Ht 5\' 11"  (1.803 m)  Wt 135 lb (61.236 kg)  BMI 18.84 kg/m2  SpO2 100%  LMP  Patient here with a critically low hemoglobin of 5.3. MCV is also low  and this appears to be an acute on chronic exacerbation of her anemia. Spoke with Dr. Darlis Loan of the [facility] faculty practice. He gives the recommendation to begin the patient on IV Premarin once now and then a repeat dose in 6 hours. The patient is also to begin megestrol 120 mg by mouth as well as 1020 mg of ferriheme. The patient will also be transfused 2 units and require observation admission. At discharge, patient should continue her Megace at 120 mg for 5 days. She may then continue on 80 milligrams daily until she is followed up at the [facility] outpatient clinic for her bleeding.   Patient admitted by Dr. Tana Coast.  Pt stable in ED with no significant deterioration in condition. The patient appears reasonably stabilized for admission considering the current resources, flow, and capabilities available in the ED at this time, and I doubt any other [facility] requiring further screening and/or treatment in the ED prior to admission.   Margarita Mail, PA-C 12/23/13 [place], MD 12/24/13 [phone]

## 2013-12-23 ENCOUNTER — Observation Stay (HOSPITAL_COMMUNITY): Payer: MEDICAID

## 2013-12-23 LAB — FOLATE: FOLATE: 10.6 ng/mL

## 2013-12-23 LAB — TYPE AND SCREEN
ABO/RH(D): O NEG
Antibody Screen: NEGATIVE
UNIT DIVISION: 0
Unit division: 0

## 2013-12-23 LAB — GC/CHLAMYDIA PROBE AMP
CT PROBE, AMP APTIMA: NEGATIVE
GC Probe RNA: NEGATIVE

## 2013-12-23 LAB — IRON AND TIBC
IRON: 1039 ug/dL — AB (ref 42–135)
UIBC: <15 ug/dL — ABNORMAL LOW (ref 125–400)

## 2013-12-23 LAB — BASIC METABOLIC PANEL
Anion gap: 15 (ref 5–15)
BUN: 21 mg/dL (ref 6–23)
CHLORIDE: 103 meq/L (ref 96–112)
CO2: 20 meq/L (ref 19–32)
CREATININE: 0.59 mg/dL (ref 0.50–1.10)
Calcium: 8.7 mg/dL (ref 8.4–10.5)
GFR calc Af Amer: >90 mL/min (ref >90)
GFR calc non Af Amer: >90 mL/min (ref >90)
Glucose, Bld: 85 mg/dL (ref 70–99)
Potassium: 4.1 mEq/L (ref 3.7–5.3)
Sodium: 138 mEq/L (ref 137–147)

## 2013-12-23 LAB — FERRITIN: Ferritin: 5 ng/mL — ABNORMAL LOW (ref 10–291)

## 2013-12-23 LAB — CBC
HEMATOCRIT: 27.4 % — AB (ref 36.0–46.0)
HEMOGLOBIN: 8.2 g/dL — AB (ref 12.0–15.0)
MCH: 19.6 pg — ABNORMAL LOW (ref 26.0–34.0)
MCHC: 29.9 g/dL — AB (ref 30.0–36.0)
MCV: 65.4 fL — AB (ref 78.0–100.0)
Platelets: 337 10*3/uL (ref 150–400)
RBC: 4.19 MIL/uL (ref 3.87–5.11)
RDW: 22.3 % — ABNORMAL HIGH (ref 11.5–15.5)
WBC: 8.8 10*3/uL (ref 4.0–10.5)

## 2013-12-23 LAB — VITAMIN B12: Vitamin B-12: 359 pg/mL (ref 211–911)

## 2013-12-23 MED ORDER — CIPROFLOXACIN HCL 500 MG PO TABS
500.0000 mg | ORAL_TABLET | Freq: Two times a day (BID) | ORAL | Status: DC
  Administered 2013-12-23: 500 mg via ORAL
  Filled 2013-12-23 (×3): qty 1

## 2013-12-23 MED ORDER — MEGESTROL ACETATE 40 MG PO TABS: ORAL_TABLET | ORAL | Status: DC

## 2013-12-23 MED ORDER — MEGESTROL ACETATE 40 MG PO TABS: 120.0000 mg | ORAL_TABLET | Freq: Every day | ORAL | Status: DC

## 2013-12-23 MED ORDER — DOCUSATE SODIUM 100 MG PO CAPS: 100.0000 mg | ORAL_CAPSULE | Freq: Two times a day (BID) | ORAL | Status: DC

## 2013-12-23 MED ORDER — CIPROFLOXACIN HCL 500 MG PO TABS: 500.0000 mg | ORAL_TABLET | Freq: Two times a day (BID) | ORAL | Status: DC

## 2013-12-23 MED ORDER — MEGESTROL ACETATE 40 MG PO TABS
ORAL_TABLET | ORAL | Status: DC
Start: 2013-12-23 — End: 2015-01-19

## 2013-12-23 MED ORDER — FERROUS SULFATE 325 (65 FE) MG PO TABS: 325.0000 mg | ORAL_TABLET | Freq: Two times a day (BID) | ORAL | Status: DC

## 2013-12-23 NOTE — Discharge Summary (Signed)
Physician Discharge Summary  Lindsey Simpson YJE:[id number] DOB: 1965/10/13 DOA: 12/22/2013  PCP: No PCP Per Patient  Admit date: 12/22/2013 Discharge date: 12/23/2013  Time spent: 40 minutes   Recommendations for Outpatient Follow-up:  1. Follow Finalized Urine Culture - U/A positive.  Placed on Cipro.  Culture not ready at time of d/c. 2. Follow up at [facility] for vaginal bleeding.  Tx'd with 2 doses of premarin and high dose megace. 3. CBC in 3-5 days.   4. Pelvic Ultrasound completed during hospitalization.  Please check results.  Discharge Diagnoses:  Principal Problem:   Symptomatic anemia Active Problems:   Vaginal bleeding   Hypotension   UTI (urinary tract infection)   Discharge Condition: stable.  Diet recommendation: iron rich diet.  Filed Weights   12/22/13 0906  Weight: 61.236 kg (135 lb)    History of present illness:  Patient is a 48 year old female with no significant past medical history presented to ED with dizziness, lightheadedness, heavy vaginal bleeding. History was obtained from the patient, who reported she had not seen a PCP or OB/GYN in more than 10 years. Patient reported that she had a normal menstrual period, lasting 7 days in the first week of November. Subsequently she had another menstrual period on November 24 which is still going on (12/6) with heavy bleeding. Patient also reports that she has been unable to afford sanitary napkins. She felt very weak, fatigued, dizzy and lightheaded. She also felt cold intolerance. Denies any dysuria, abdominal pain, fevers or chills.  In the ED, hemoglobin found to be 5.3 patient was admitted for symptomatic anemia requiring blood transfusion.    Hospital Course:   Symptomatic anemia due to menorrhagia.  Hgb 5.3, MCV 61.4 on admission.  Received 2 units of PRBCs, along with IV iron.  Hgb 8.2 at the time of discharge.  The admitting physician spoke with Dr. Darlis Loan of gynecology who recommended  the patient received IV Premarin 2 doses, and Megace 120 mg daily for 5 days, then 80 mg daily until she is seen at [facility] clinic. This recommendation was followed and the patient was discharged on high dose Megace.  Pelvic ultrasound was completed inpatient and will follow up with Gyn as an outpatient   TSH was normal at 1.140  Urinary tract infection  The patient received 1 dose of IV Rocephin and will be discharged on oral Cipro   Urine cultures have not yet resulted at the time of discharge. Please check cultures on hospital follow-up  Hypotension  Resolved after blood transfusion and IV fluids.   Procedures:  Pelvic Ultrasound  Received 2 Units of PRBCs and IV iron.  Consultations:  Curb sided GYN  Discharge Exam: Filed Vitals:   12/23/13 0610  BP: 114/70  Pulse: 58  Temp: 98.2 F (36.8 C)  Resp: 18    General: Well-developed, thin, AA female, lying comfortably in bed, appears to have a lot of energy, boyfriend at bedside Cardiovascular: Regular rate and rhythm, no murmurs rubs or gallops appreciated Respiratory: Clear to auscultation no wheezes crackles or rales, no increased work of breathing Abdominal: Soft, nontender, nondistended, normal bowel sounds Extremities: No swelling cyanosis or edema, patient able to ambulate about the room  Discharge Instructions   Discharge Instructions    Diet - low sodium heart healthy    Complete by:  As directed      Diet - low sodium heart healthy    Complete by:  As directed  Increase activity slowly    Complete by:  As directed      Increase activity slowly    Complete by:  As directed           Current Discharge Medication List    START taking these medications   Details  ciprofloxacin (CIPRO) 500 MG tablet Take 1 tablet (500 mg total) by mouth 2 (two) times daily. Qty: 8 tablet, Refills: 0    docusate sodium (COLACE) 100 MG capsule Take 1 capsule (100 mg total) by mouth 2 (two) times daily. Qty: 60  capsule, Refills: 0    ferrous sulfate 325 (65 FE) MG tablet Take 1 tablet (325 mg total) by mouth 2 (two) times daily with a meal. Qty: 60 tablet, Refills: 3    megestrol (MEGACE) 40 MG tablet Take 3 tabs daily for 3 days.  Then take 2 tabs daily until you see the gynecologist at [facility]. Qty: 30 tablet, Refills: 0      STOP taking these medications     ibuprofen (ADVIL,MOTRIN) 200 MG tablet      amoxicillin (AMOXIL) 500 MG capsule        No Known Allergies Follow-up Information    Follow up with [place]. Schedule an appointment as soon as possible for a visit in 3 days.   Contact information:   [place] [phone]       The results of significant diagnostics from this hospitalization (including imaging, microbiology, ancillary and laboratory) are listed below for reference.    Significant Diagnostic Studies: No results found.  Microbiology: Recent Results (from the past 240 hour(s))  Wet prep, genital     Status: Abnormal   Collection Time: 12/22/13 10:45 AM  Result Value Ref Range Status   Yeast Wet Prep HPF POC NONE SEEN NONE SEEN Final   Trich, Wet Prep NONE SEEN NONE SEEN Final   Clue Cells Wet Prep HPF POC NONE SEEN NONE SEEN Final   WBC, Wet Prep HPF POC FEW (A) NONE SEEN Final     Labs: Basic Metabolic Panel:  Recent Labs Lab 12/22/13 1105 12/23/13 0526  NA 137 138  K 3.8 4.1  CL 103 103  CO2 23 20  GLUCOSE 85 85  BUN 17 21  CREATININE 0.70 0.59  CALCIUM 8.7 8.7   CBC:  Recent Labs Lab 12/22/13 0936 12/22/13 2030 12/23/13 0526  WBC 4.4  --  8.8  HGB 5.3* 7.8* 8.2*  HCT 20.8* 27.1* 27.4*  MCV 61.4*  --  65.4*  PLT 395  --  337       Signed:  YorkBobby Rumpf, PA-C  [facility] 12/23/2013, 10:25 AM      Patient seen and examined, chart and data base reviewed.  I agree with the above assessment and plan and have edited the above note  For full  details please see Mrs. Imogene Burn PA note.  Marzetta Board, MD [facility] [phone]

## 2013-12-23 NOTE — Care Management Note (Signed)
    Page 1 of 1   12/23/2013     7:53:44 PM CARE MANAGEMENT NOTE 12/23/2013  Patient:  Lindsey Simpson, Lindsey Simpson   Account Number:  [id number]  Date Initiated:  12/23/2013  Documentation initiated by:  Tomi Bamberger  Subjective/Objective Assessment:   dx vag bleeding  admit as observation- lives with spouse     Action/Plan:   Anticipated DC Date:  12/23/2013   Anticipated DC Plan:  [place]  CM consult  Follow-up appt scheduled      Choice offered to / List presented to:             Status of service:  Completed, signed off Medicare Important Message given?  NO (If response is "NO", the following Medicare IM given date fields will be blank) Date Medicare IM given:   Medicare IM given by:   Date Additional Medicare IM given:   Additional Medicare IM given by:    Discharge Disposition:  HOME/SELF CARE  Per UR Regulation:  Reviewed for med. necessity/level of care/duration of stay  If discussed at [place] of Stay Meetings, dates discussed:    Comments:  12/23/13 Tomi Bamberger RN, BSN [phone] patient for dc today, NCM gave patient information regarding [facility] clinic and medications.

## 2013-12-23 NOTE — Progress Notes (Signed)
Nsg Discharge Note  Admit Date:  12/22/2013 Discharge date: 12/23/2013   Lindsey Simpson to be D/C'd Home per MD order.  AVS completed.  Copy for chart, and copy for patient signed, and dated. Patient/caregiver able to verbalize understanding.  Discharge Medication:   Medication List    STOP taking these medications        amoxicillin 500 MG capsule  Commonly known as:  AMOXIL     ibuprofen 200 MG tablet  Commonly known as:  ADVIL,MOTRIN      TAKE these medications        ciprofloxacin 500 MG tablet  Commonly known as:  CIPRO  Take 1 tablet (500 mg total) by mouth 2 (two) times daily.     docusate sodium 100 MG capsule  Commonly known as:  COLACE  Take 1 capsule (100 mg total) by mouth 2 (two) times daily.     ferrous sulfate 325 (65 FE) MG tablet  Take 1 tablet (325 mg total) by mouth 2 (two) times daily with a meal.     megestrol 40 MG tablet  Commonly known as:  MEGACE  Take 3 tabs daily for 3 days.  Then take 2 tabs daily until you see the gynecologist at [facility].        Discharge Assessment: Filed Vitals:   12/23/13 0610  BP: 114/70  Pulse: 58  Temp: 98.2 F (36.8 C)  Resp: 18   Skin clean, dry and intact without evidence of skin break down, no evidence of skin tears noted. IV catheter discontinued intact. Site without signs and symptoms of complications - no redness or edema noted at insertion site, patient denies c/o pain - only slight tenderness at site.  Dressing with slight pressure applied.  D/c Instructions-Education: Discharge instructions given to patient/family with verbalized understanding. D/c education completed with patient/family including follow up instructions, medication list, d/c activities limitations if indicated, with other d/c instructions as indicated by MD - patient able to verbalize understanding, all questions fully answered. Patient instructed to return to ED, call 911, or call MD for any changes in condition.  Patient  escorted via volunteer services.  Dayle Points, RN 12/23/2013 12:56 PM

## 2013-12-23 NOTE — Plan of Care (Signed)
Problem: Phase II Progression Outcomes Goal: IV changed to normal saline lock Outcome: Completed/Met Date Met:  12/23/13     

## 2013-12-23 NOTE — Plan of Care (Signed)
Problem: Phase I Progression Outcomes Goal: Pain controlled with appropriate interventions Outcome: Completed/Met Date Met:  12/23/13     

## 2013-12-23 NOTE — Discharge Instructions (Signed)
Take stool softeners (Colace - ducosate sodium) while you are taking iron or you will become constipated.  It is extremely important that you follow up at the [facility] at [facility] with in the next 3-7 days.  You will need your blood work checked.    Take your antibiotic (Cipro) twice a day for 4 more days.

## 2013-12-24 LAB — URINE CULTURE: Colony Count: >=100000

## 2013-12-25 ENCOUNTER — Encounter: Payer: Self-pay | Admitting: Internal Medicine

## 2013-12-25 ENCOUNTER — Ambulatory Visit: Payer: Self-pay | Attending: Internal Medicine | Admitting: Internal Medicine

## 2013-12-25 ENCOUNTER — Encounter: Payer: Self-pay | Admitting: Obstetrics & Gynecology

## 2013-12-25 VITALS — BP 115/75 | HR 98 | Temp 97.9°F | Resp 16 | Ht 70.0 in | Wt 130.0 lb

## 2013-12-25 DIAGNOSIS — D250 Submucous leiomyoma of uterus: Secondary | ICD-10-CM

## 2013-12-25 DIAGNOSIS — N3000 Acute cystitis without hematuria: Secondary | ICD-10-CM

## 2013-12-25 DIAGNOSIS — D649 Anemia, unspecified: Secondary | ICD-10-CM

## 2013-12-25 MED ORDER — FERROUS SULFATE 325 (65 FE) MG PO TABS: 325.0000 mg | ORAL_TABLET | Freq: Two times a day (BID) | ORAL | Status: DC

## 2013-12-25 MED ORDER — SULFAMETHOXAZOLE-TRIMETHOPRIM 800-160 MG PO TABS: 1.0000 | ORAL_TABLET | Freq: Two times a day (BID) | ORAL | Status: DC

## 2013-12-25 NOTE — Patient Instructions (Signed)
Anemia, Nonspecific Anemia is a condition in which the concentration of red blood cells or hemoglobin in the blood is below normal. Hemoglobin is a substance in red blood cells that carries oxygen to the tissues of the body. Anemia results in not enough oxygen reaching these tissues.  CAUSES  Common causes of anemia include:   Excessive bleeding. Bleeding may be internal or external. This includes excessive bleeding from periods (in women) or from the intestine.   Poor nutrition.   Chronic kidney, thyroid, and liver disease.  Bone marrow disorders that decrease red blood cell production.  Cancer and treatments for cancer.  HIV, AIDS, and their treatments.  Spleen problems that increase red blood cell destruction.  Blood disorders.  Excess destruction of red blood cells due to infection, medicines, and autoimmune disorders. SIGNS AND SYMPTOMS   Minor weakness.   Dizziness.   Headache.  Palpitations.   Shortness of breath, especially with exercise.   Paleness.  Cold sensitivity.  Indigestion.  Nausea.  Difficulty sleeping.  Difficulty concentrating. Symptoms may occur suddenly or they may develop slowly.  DIAGNOSIS  Additional blood tests are often needed. These help your health care provider determine the best treatment. Your health care provider will check your stool for blood and look for other causes of blood loss.  TREATMENT  Treatment varies depending on the cause of the anemia. Treatment can include:   Supplements of iron, vitamin B12, or folic acid.   Hormone medicines.   A blood transfusion. This may be needed if blood loss is severe.   Hospitalization. This may be needed if there is significant continual blood loss.   Dietary changes.  Spleen removal. HOME CARE INSTRUCTIONS Keep all follow-up appointments. It often takes many weeks to correct anemia, and having your health care provider check on your condition and your response to  treatment is very important. SEEK IMMEDIATE MEDICAL CARE IF:   You develop extreme weakness, shortness of breath, or chest pain.   You become dizzy or have trouble concentrating.  You develop heavy vaginal bleeding.   You develop a rash.   You have bloody or black, tarry stools.   You faint.   You vomit up blood.   You vomit repeatedly.   You have abdominal pain.  You have a fever or persistent symptoms for more than 2-3 days.   You have a fever and your symptoms suddenly get worse.   You are dehydrated.  MAKE SURE YOU:  Understand these instructions.  Will watch your condition.  Will get help right away if you are not doing well or get worse. Document Released: 02/11/2004 Document Revised: 09/05/2012 Document Reviewed: 06/29/2012 Exit[employer] Patient Information 2015 [employer]. This information is not intended to replace advice given to you by your health care provider. Make sure you discuss any questions you have with your health care provider.  

## 2013-12-25 NOTE — Progress Notes (Signed)
HFU Pt was in the ED with heavy vaginal bleeding.  Pt was diagnosed with anemia.

## 2013-12-25 NOTE — Progress Notes (Signed)
Patient ID: Lindsey Simpson, female   DOB: [birth year], 48 y.o.   MRN: [id number]  EGB:[id number]  [id number]  DOB - 1965-12-07  CC:  Chief Complaint  Patient presents with  . Hospitalization Follow-up       HPI: Lindsey Simpson is a 48 y.o. female with no significant past medical history presented to the ED on 12/6 with dizziness, lightheadedness, heavy vaginal bleeding.  Patient reported that she had a normal menstrual period, lasting 7 days in the first week of November. Subsequently she had another menstrual period on November 24 which was still present on12/6 with heavy bleeding. She felt very weak, fatigued, dizzy and lightheaded. She also felt cold intolerance. Denies any dysuria, abdominal pain, fevers or chills. In the ED the patient was found to have a hemoglobin of 5.3, and was admitted at that time for symptomatic anemia requiring blood transfusion and IV iron.  Today she reports that she has continued the Megace to her hospital discharge but has not begun ferrous sulfate due to inability to pay for medication.  Patient has No headache, No chest pain, No abdominal pain - No Nausea, No new weakness tingling or numbness, No Cough - SOB.  No Known Allergies Past Medical History  Diagnosis Date  . Anemia    Current Outpatient Prescriptions on File Prior to Visit  Medication Sig Dispense Refill  . ciprofloxacin (CIPRO) 500 MG tablet Take 1 tablet (500 mg total) by mouth 2 (two) times daily. 8 tablet 0  . megestrol (MEGACE) 40 MG tablet Take 3 tabs daily for 3 days.  Then take 2 tabs daily until you see the gynecologist at [facility]. 60 tablet 0  . docusate sodium (COLACE) 100 MG capsule Take 1 capsule (100 mg total) by mouth 2 (two) times daily. (Patient not taking: Reported on 12/25/2013) 60 capsule 0  . ferrous sulfate 325 (65 FE) MG tablet Take 1 tablet (325 mg total) by mouth 2 (two) times daily with a meal. (Patient not taking: Reported on 12/25/2013) 60 tablet 3   No  current facility-administered medications on file prior to visit.   Family History  Problem Relation Age of Onset  . Cancer Mother   . Cancer Father   . Diabetes Father    History   Social History  . Marital Status: Single    Spouse Name: N/A    Number of Children: N/A  . Years of Education: N/A   Occupational History  . Not on file.   Social History Main Topics  . Smoking status: Never Smoker   . Smokeless tobacco: Not on file  . Alcohol Use: Yes     Comment: occassional  . Drug Use: No  . Sexual Activity: Not on file   Other Topics Concern  . Not on file   Social History Narrative    Review of Systems: Constitutional: Negative for fever, chills, diaphoresis, activity change, appetite change and fatigue. HENT: Negative for ear pain, nosebleeds, congestion, facial swelling, rhinorrhea, neck pain, neck stiffness and ear discharge.  Eyes: Negative for pain, discharge, redness, itching and visual disturbance. Respiratory: Negative for cough, choking, chest tightness, shortness of breath, wheezing and stridor.  Cardiovascular: Negative for chest pain, palpitations and leg swelling. Gastrointestinal: Negative for abdominal distention. Genitourinary: Negative for dysuria, urgency, frequency, hematuria, flank pain, decreased urine volume, difficulty urinating and dyspareunia.  Musculoskeletal: Negative for back pain, joint swelling, arthralgia and gait problem. Neurological: Negative for dizziness, tremors, seizures, syncope, facial asymmetry, speech difficulty, weakness, light-headedness, numbness  and headaches.  Hematological: Negative for adenopathy. Does not bruise/bleed easily. Psychiatric/Behavioral: Negative for hallucinations, behavioral problems, confusion, dysphoric mood, decreased concentration and agitation.    Objective:   Filed Vitals:   12/25/13 1001  BP: 115/75  Pulse: 98  Temp: 97.9 F (36.6 C)  Resp: 16    Physical Exam: Constitutional: Patient  appears well-developed and well-nourished. No distress. HENT: Normocephalic, atraumatic, External right and left ear normal. Oropharynx is clear and moist.  Eyes: Conjunctivae and EOM are normal. PERRLA, no scleral icterus. Neck: Normal ROM. Neck supple. No JVD. No tracheal deviation. No thyromegaly. CVS: RRR, S1/S2 +, no murmurs, no gallops, no carotid bruit.  Pulmonary: Effort and breath sounds normal, no stridor, rhonchi, wheezes, rales.  Abdominal: Soft. BS +, no distension, tenderness, rebound or guarding.  Musculoskeletal: Normal range of motion. No edema and no tenderness.  Lymphadenopathy: No lymphadenopathy noted, cervical, Neuro: Alert. Normal reflexes, muscle tone coordination. No cranial nerve deficit. Skin: Skin is warm and dry. No rash noted. Not diaphoretic. No erythema. No pallor. Psychiatric: Normal mood and affect. Behavior, judgment, thought content normal.  Lab Results  Component Value Date   WBC 8.8 12/23/2013   HGB 8.2* 12/23/2013   HCT 27.4* 12/23/2013   MCV 65.4* 12/23/2013   PLT 337 12/23/2013   Lab Results  Component Value Date   CREATININE 0.59 12/23/2013   BUN 21 12/23/2013   NA 138 12/23/2013   K 4.1 12/23/2013   CL 103 12/23/2013   CO2 20 12/23/2013    No results found for: HGBA1C Lipid Panel  No results found for: CHOL, TRIG, HDL, CHOLHDL, VLDL, LDLCALC     Assessment and plan:   Jeneal was seen today for hospitalization follow-up.  Diagnoses and associated orders for this visit:  Submucous leiomyoma of uterus - Ambulatory referral to Gynecology--for treatment options and to follow up on Megace use  Anemia, unspecified anemia type - ferrous sulfate 325 (65 FE) MG tablet; Take 1 tablet (325 mg total) by mouth 2 (two) times daily with a meal.--- Explained importance of use - CBC; Future  Acute cystitis without hematuria - sulfamethoxazole-trimethoprim (BACTRIM DS,SEPTRA DS) 800-160 MG per tablet; Take 1 tablet by mouth 2 (two) times  daily. Discontinue patient Cipro due to resistance, switch to Bactrim   Return for Friday for Lab Visit-CBC and 2 mo PCP.  The patient was given clear instructions to go to ER or return to medical center if symptoms don't improve, worsen or new problems develop. The patient verbalized understanding. The patient was told to call to get lab results if they haven't heard anything in the next week.     Chari Manning, [facility] [phone] 12/25/2013, 10:18 AM

## 2013-12-27 ENCOUNTER — Ambulatory Visit: Payer: Self-pay | Attending: Internal Medicine

## 2013-12-27 DIAGNOSIS — D649 Anemia, unspecified: Secondary | ICD-10-CM

## 2013-12-28 LAB — CBC
HCT: 32 % — ABNORMAL LOW (ref 36.0–46.0)
Hemoglobin: 9 g/dL — ABNORMAL LOW (ref 12.0–15.0)
MCH: 19.9 pg — ABNORMAL LOW (ref 26.0–34.0)
MCHC: 28.1 g/dL — ABNORMAL LOW (ref 30.0–36.0)
MCV: 70.6 fL — ABNORMAL LOW (ref 78.0–100.0)
MPV: 9.4 fL (ref 9.4–12.4)
Platelets: 438 K/uL — ABNORMAL HIGH (ref 150–400)
RBC: 4.53 MIL/uL (ref 3.87–5.11)
RDW: 27 % — ABNORMAL HIGH (ref 11.5–15.5)
WBC: 7.1 K/uL (ref 4.0–10.5)

## 2013-12-30 ENCOUNTER — Telehealth: Payer: Self-pay | Admitting: *Deleted

## 2013-12-30 DIAGNOSIS — D509 Iron deficiency anemia, unspecified: Secondary | ICD-10-CM

## 2013-12-30 NOTE — Telephone Encounter (Signed)
Left message on patient's VM to return call to discuss lab results Future order for CBC placed

## 2013-12-30 NOTE — Telephone Encounter (Signed)
-----   Message from Lance Bosch, NP sent at 12/29/2013 10:16 PM EST ----- Still significantly low hemoglobin.  Please stress importance of taking iron twice daily.  Will repeat cbc in one month, please place future order.

## 2013-12-31 ENCOUNTER — Telehealth: Payer: Self-pay | Admitting: Internal Medicine

## 2013-12-31 NOTE — Telephone Encounter (Signed)
Pt. Is returning nurses call to request blood work results, please f/u with pt. At [phone]

## 2014-01-01 ENCOUNTER — Telehealth: Payer: Self-pay | Admitting: Internal Medicine

## 2014-01-01 NOTE — Telephone Encounter (Signed)
Pt. Called to request blood work results, please f/u with pt. °

## 2014-01-07 ENCOUNTER — Encounter: Payer: Self-pay | Admitting: *Deleted

## 2014-01-07 ENCOUNTER — Telehealth: Payer: Self-pay | Admitting: Internal Medicine

## 2014-01-07 ENCOUNTER — Telehealth: Payer: Self-pay

## 2014-01-07 NOTE — Telephone Encounter (Signed)
Returned patient phone call Patient is aware of her lab results and that she should Be taking her iron pills twice daily. Patient had stated she was only Taking her medication once daily per instructions on the bottle Will start taking her iron BID

## 2014-01-07 NOTE — Telephone Encounter (Signed)
Pt. Called to request blood work results, please f/u with pt.

## 2014-01-07 NOTE — Progress Notes (Signed)
Pt is aware of her lab results.  

## 2014-02-05 ENCOUNTER — Encounter: Payer: Self-pay | Admitting: Obstetrics & Gynecology

## 2014-02-05 ENCOUNTER — Ambulatory Visit (INDEPENDENT_AMBULATORY_CARE_PROVIDER_SITE_OTHER): Payer: Self-pay | Admitting: Obstetrics & Gynecology

## 2014-02-05 VITALS — BP 99/77 | HR 74 | Temp 98.3°F | Wt 132.1 lb

## 2014-02-05 DIAGNOSIS — Z1151 Encounter for screening for human papillomavirus (HPV): Secondary | ICD-10-CM

## 2014-02-05 DIAGNOSIS — Z01419 Encounter for gynecological examination (general) (routine) without abnormal findings: Secondary | ICD-10-CM

## 2014-02-05 DIAGNOSIS — Z0000 Encounter for general adult medical examination without abnormal findings: Secondary | ICD-10-CM

## 2014-02-05 DIAGNOSIS — D500 Iron deficiency anemia secondary to blood loss (chronic): Secondary | ICD-10-CM

## 2014-02-05 DIAGNOSIS — Z124 Encounter for screening for malignant neoplasm of cervix: Secondary | ICD-10-CM

## 2014-02-05 DIAGNOSIS — D250 Submucous leiomyoma of uterus: Secondary | ICD-10-CM | POA: Insufficient documentation

## 2014-02-05 DIAGNOSIS — N920 Excessive and frequent menstruation with regular cycle: Secondary | ICD-10-CM

## 2014-02-05 NOTE — Progress Notes (Signed)
   Subjective:    Patient ID: Lindsey Simpson, female    DOB: 11/16/1965, 49 y.o.   MRN: [id number]  HPI  49 yo MAA P5 here for evaluation of menorrhagia, anemia (requiring transfusion) and a 6 cm partly submucosal fibroid.   Review of Systems     Objective:   Physical Exam  Thin anxious AA lady Breathing normally Abd- scaffoid Uterus enlarged but exam very difficult due to constant abdominal tensing by Lindsey Simpson      Assessment & Plan:  Preventative care- pap smear obtained Menorrhagia/fibroid- We discussed treatment options. She vehemently declines a hysterectomy. She is willing to have a depo provera injection until the time when she can get depot Lupron RTC q 3 months for depo provera She declines a flu vaccine.  Addendum: She does not have insurance and declines to incur the cost of the depo provera shot today so she will go to the health dept to get it there. She was also offered the opportunity to fill out the paperwork for free medicine.

## 2014-02-07 LAB — CYTOLOGY - PAP

## 2014-03-14 ENCOUNTER — Emergency Department (HOSPITAL_COMMUNITY)
Admission: EM | Admit: 2014-03-14 | Discharge: 2014-03-14 | Disposition: A | Discharge status: Home / Self Care | Payer: Self-pay | Attending: Emergency Medicine | Admitting: Emergency Medicine

## 2014-03-14 ENCOUNTER — Encounter (HOSPITAL_COMMUNITY): Payer: Self-pay | Admitting: Emergency Medicine

## 2014-03-14 DIAGNOSIS — R51 Headache: Secondary | ICD-10-CM | POA: Insufficient documentation

## 2014-03-14 DIAGNOSIS — R519 Headache, unspecified: Secondary | ICD-10-CM

## 2014-03-14 DIAGNOSIS — Z792 Long term (current) use of antibiotics: Secondary | ICD-10-CM | POA: Insufficient documentation

## 2014-03-14 DIAGNOSIS — Z79899 Other long term (current) drug therapy: Secondary | ICD-10-CM | POA: Insufficient documentation

## 2014-03-14 DIAGNOSIS — D649 Anemia, unspecified: Secondary | ICD-10-CM | POA: Insufficient documentation

## 2014-03-14 DIAGNOSIS — Z8679 Personal history of other diseases of the circulatory system: Secondary | ICD-10-CM | POA: Insufficient documentation

## 2014-03-14 MED ORDER — BUTALBITAL-APAP-CAFFEINE 50-325-40 MG PO TABS: 1.0000 | ORAL_TABLET | Freq: Four times a day (QID) | ORAL | Status: AC | PRN

## 2014-03-14 NOTE — ED Notes (Signed)
Pt c/o migraine intermittently x 2 months; pt sts took ibuprofen with decrease in pain today

## 2014-03-14 NOTE — ED Provider Notes (Signed)
CSN: [id number]     Arrival date & time 03/14/14  1709 History  This chart was scribed for Domenic Moras, PA-C working Everlene Balls, MD by Randa Evens, ED Scribe. This patient was seen in room A11C/A11C and the patient's care was started at 11:19 PM.      Chief Complaint  Patient presents with  . Headache   Patient is a 49 y.o. female presenting with headaches. The history is provided by the patient. No language interpreter was used.  Headache Associated symptoms: no dizziness, no fever, no nausea, no neck stiffness and no vomiting    HPI Comments: Lindsey Simpson is a 49 y.o. female with PMHx of migraines who presents to the Emergency Department complaining of recurrent acute onset of improving stabbing migraine HA onset today. Pt states that she has been having intermittent HA's for the past 2 months. Pt states that HA is located in right temporal. Pt states she has some intermittent blurred vision. Pt states that she has taken 400 mg of ibuprofen that has provided relief. Pt states that sleeping sometimes provides relief. Denies fever, neck stiffness, nausea, vomiting, light headedness, weight change, night sweats or dizziness. Pt states that this HA feels like previous HA's. Pt states that she had similar HA for several years ago that resolved with an injection. Denies Hx of cancer  Past Medical History  Diagnosis Date  . Anemia    Past Surgical History  Procedure Laterality Date  . Tubal ligation     Family History  Problem Relation Age of Onset  . Cancer Mother   . Cancer Father   . Diabetes Father    History  Substance Use Topics  . Smoking status: Never Smoker   . Smokeless tobacco: Never Used  . Alcohol Use: Yes     Comment: occassional   OB History    Gravida Para Term Preterm AB TAB SAB Ectopic Multiple Living   6 5 5  0 1 0 1 0 0 5     Review of Systems  Constitutional: Negative for fever and appetite change.  Gastrointestinal: Negative for nausea and vomiting.   Musculoskeletal: Negative for neck stiffness.  Neurological: Positive for headaches. Negative for dizziness and light-headedness.     Allergies  Review of patient's allergies indicates no known allergies.  Home Medications   Prior to Admission medications   Medication Sig Start Date End Date Taking? Authorizing Provider  docusate sodium (COLACE) 100 MG capsule Take 1 capsule (100 mg total) by mouth 2 (two) times daily. Patient not taking: Reported on 12/25/2013 12/23/13   Melton Alar, PA-C  ferrous sulfate 325 (65 FE) MG tablet Take 1 tablet (325 mg total) by mouth 2 (two) times daily with a meal. 12/25/13   Lance Bosch, NP  megestrol (MEGACE) 40 MG tablet Take 3 tabs daily for 3 days.  Then take 2 tabs daily until you see the gynecologist at [facility]. Patient not taking: Reported on 02/05/2014 12/23/13   Caren Griffins, MD  sulfamethoxazole-trimethoprim (BACTRIM DS,SEPTRA DS) 800-160 MG per tablet Take 1 tablet by mouth 2 (two) times daily. Patient not taking: Reported on 02/05/2014 12/25/13   Lance Bosch, NP   BP 118/65 mmHg  Pulse 58  Temp(Src) 97.9 F (36.6 C) (Oral)  Resp 16  SpO2 100%   Physical Exam  Constitutional: She is oriented to person, place, and time. She appears well-developed and well-nourished. No distress.  HENT:  Head: Normocephalic and atraumatic.  Eyes: Conjunctivae  and EOM are normal. Pupils are equal, round, and reactive to light.  Neck: Neck supple. No tracheal deviation present.  No nuchal rigidity  Cardiovascular: Normal rate.   Pulmonary/Chest: Effort normal. No respiratory distress.  Musculoskeletal: Normal range of motion.  Neurological: She is alert and oriented to person, place, and time. She has normal strength. No cranial nerve deficit or sensory deficit. She displays a negative Romberg sign. Coordination and gait normal. GCS eye subscore is 4. GCS verbal subscore is 5. GCS motor subscore is 6.  Skin: Skin is warm and dry.   Psychiatric: She has a normal mood and affect. Her behavior is normal.  Nursing note and vitals reviewed.   ED Course  Procedures (including critical care time) DIAGNOSTIC STUDIES: Oxygen Saturation is 100% on RA, normal by my interpretation.    COORDINATION OF CARE: 11:32 PM-Discussed treatment plan with pt at bedside and pt agreed to plan.   11:35 PM Headache similar to previous, no fever, neck stiffness, neuro findings or new symptoms to suggest more serious etiology.  I don't think SAH, ICH, meningitis, encephalitis, mass at this time.  No recent trauma.  I don't feel imaging necessary at this time.  Headache is essentially resolved without treatment.      Labs Review Labs Reviewed - No data to display  Imaging Review No results found.   EKG Interpretation None      MDM   Final diagnoses:  Bad headache   BP 123/76 mmHg  Pulse 69  Temp(Src) 97.9 F (36.6 C) (Oral)  Resp 16  SpO2 100%   I personally performed the services described in this documentation, which was scribed in my presence. The recorded information has been reviewed and is accurate.       Domenic Moras, PA-C 03/14/14 2336  Wandra Arthurs, MD 03/15/14 [phone]

## 2014-03-14 NOTE — Discharge Instructions (Signed)

## 2014-04-28 ENCOUNTER — Ambulatory Visit: Payer: Self-pay

## 2015-01-19 ENCOUNTER — Emergency Department (HOSPITAL_COMMUNITY)
Admission: EM | Admit: 2015-01-19 | Discharge: 2015-01-19 | Disposition: A | Discharge status: Home / Self Care | Payer: Medicaid Other | Attending: Emergency Medicine | Admitting: Emergency Medicine

## 2015-01-19 ENCOUNTER — Encounter (HOSPITAL_COMMUNITY): Payer: Self-pay

## 2015-01-19 DIAGNOSIS — Z79899 Other long term (current) drug therapy: Secondary | ICD-10-CM | POA: Insufficient documentation

## 2015-01-19 DIAGNOSIS — N939 Abnormal uterine and vaginal bleeding, unspecified: Secondary | ICD-10-CM | POA: Insufficient documentation

## 2015-01-19 DIAGNOSIS — Z3202 Encounter for pregnancy test, result negative: Secondary | ICD-10-CM | POA: Diagnosis not present

## 2015-01-19 DIAGNOSIS — D649 Anemia, unspecified: Secondary | ICD-10-CM

## 2015-01-19 LAB — URINALYSIS, ROUTINE W REFLEX MICROSCOPIC
Bilirubin Urine: NEGATIVE
Glucose, UA: NEGATIVE mg/dL
Ketones, ur: 15 mg/dL — AB
NITRITE: NEGATIVE
Protein, ur: >300 mg/dL — AB
Specific Gravity, Urine: 1.024 (ref 1.005–1.030)
pH: 7.5 (ref 5.0–8.0)

## 2015-01-19 LAB — URINE MICROSCOPIC-ADD ON

## 2015-01-19 LAB — PROTIME-INR
INR: 1.06 (ref 0.00–1.49)
Prothrombin Time: 14 seconds (ref 11.6–15.2)

## 2015-01-19 LAB — CBC
HEMATOCRIT: 24.3 % — AB (ref 36.0–46.0)
Hemoglobin: 7 g/dL — ABNORMAL LOW (ref 12.0–15.0)
MCH: 19.6 pg — AB (ref 26.0–34.0)
MCHC: 28.8 g/dL — AB (ref 30.0–36.0)
MCV: 67.9 fL — ABNORMAL LOW (ref 78.0–100.0)
PLATELETS: 344 10*3/uL (ref 150–400)
RBC: 3.58 MIL/uL — ABNORMAL LOW (ref 3.87–5.11)
RDW: 17 % — ABNORMAL HIGH (ref 11.5–15.5)
WBC: 5.1 10*3/uL (ref 4.0–10.5)

## 2015-01-19 LAB — BASIC METABOLIC PANEL
Anion gap: 7 (ref 5–15)
BUN: 19 mg/dL (ref 6–20)
CHLORIDE: 103 mmol/L (ref 101–111)
CO2: 28 mmol/L (ref 22–32)
CREATININE: 0.74 mg/dL (ref 0.44–1.00)
Calcium: 8.9 mg/dL (ref 8.9–10.3)
GFR calc Af Amer: >60 mL/min (ref >60)
GFR calc non Af Amer: >60 mL/min (ref >60)
GLUCOSE: 104 mg/dL — AB (ref 65–99)
POTASSIUM: 3.8 mmol/L (ref 3.5–5.1)
Sodium: 138 mmol/L (ref 135–145)

## 2015-01-19 LAB — I-STAT BETA HCG BLOOD, ED (MC, WL, AP ONLY): I-stat hCG, quantitative: <5 m[IU]/mL (ref <5)

## 2015-01-19 LAB — APTT: aPTT: 32 seconds (ref 24–37)

## 2015-01-19 MED ORDER — MEGESTROL ACETATE 40 MG PO TABS: 80.0000 mg | ORAL_TABLET | Freq: Two times a day (BID) | ORAL | Status: DC

## 2015-01-19 MED ORDER — MEGESTROL ACETATE 40 MG PO TABS
80.0000 mg | ORAL_TABLET | Freq: Every day | ORAL | Status: DC
  Administered 2015-01-19: 80 mg via ORAL
  Filled 2015-01-19: qty 2

## 2015-01-19 NOTE — ED Notes (Signed)
Spouse has possession of personal property

## 2015-01-19 NOTE — ED Notes (Signed)
Pt ambulated to restroom and urinated in specimen cup, but dumped it out and stated there was too much blood. Advised patient to keep next sample for analysis regardless of appearance.

## 2015-01-19 NOTE — Discharge Instructions (Signed)

## 2015-01-19 NOTE — ED Notes (Signed)
Patient reports that she has had heavy vaginal bleeding with clots x 2 weeks, reports that she feels weak due to same. Alert and oriented, no distress

## 2015-01-19 NOTE — ED Provider Notes (Addendum)
CSN: [id number]     Arrival date & time 01/19/15  74 History   First MD Initiated Contact with Patient 01/19/15 1900     Chief Complaint  Patient presents with  . Weakness  . Vaginal Bleeding     (Consider location/radiation/quality/duration/timing/severity/associated sxs/prior Treatment) HPI  Patient is a 50 year old female, she has a history of anemia and heavy vaginal bleeding, she had a submucosal leiomyoma that was thought to be the culprit, in December 2015 after having a blood transfusion for severe symptomatic anemia, she was given several recommendations including possible hysterectomy and other medication-related solutions and she declined those, she did not seek repeat evaluation, she did not seek medical insurance and has not been back to the Medicaid office in over a year. She states that over the last couple of weeks she has had heavy bleeding including daily heavy clots, she does not wear pads or tampons but states that she folds up tissues and places them in her pants and, sometimes wears a diaper. She denies abdominal pain, denies back pain, denies dysuria or diarrhea or rectal bleeding. She does not get lightheaded short of breath and has no generalized weakness or focal weakness. She denies any symptoms of orthostasis. She does not take birth control pills  Past Medical History  Diagnosis Date  . Anemia    Past Surgical History  Procedure Laterality Date  . Tubal ligation     Family History  Problem Relation Age of Onset  . Cancer Mother   . Cancer Father   . Diabetes Father    Social History  Substance Use Topics  . Smoking status: Never Smoker   . Smokeless tobacco: Never Used  . Alcohol Use: Yes     Comment: occassional   OB History    Gravida Para Term Preterm AB TAB SAB Ectopic Multiple Living   6 5 5  0 1 0 1 0 0 5     Review of Systems  All other systems reviewed and are negative.     Allergies  Review of patient's allergies indicates no known  allergies.  Home Medications   Prior to Admission medications   Medication Sig Start Date End Date Taking? Authorizing Provider  butalbital-acetaminophen-caffeine (FIORICET) [phone] MG per tablet Take 1-2 tablets by mouth every 6 (six) hours as needed for headache. 03/14/14 03/14/15  Domenic Moras, PA-C  docusate sodium (COLACE) 100 MG capsule Take 1 capsule (100 mg total) by mouth 2 (two) times daily. Patient not taking: Reported on 12/25/2013 12/23/13   Melton Alar, PA-C  ferrous sulfate 325 (65 FE) MG tablet Take 1 tablet (325 mg total) by mouth 2 (two) times daily with a meal. 12/25/13   Lance Bosch, NP  megestrol (MEGACE) 40 MG tablet Take 2 tablets (80 mg total) by mouth 2 (two) times daily. Take twice daily until bleeding stops - consult with your Gynecologist before starting this medicine 01/19/15   Noemi Chapel, MD   BP 105/78 mmHg  Pulse 69  Temp(Src) 98 F (36.7 C) (Oral)  Resp 18  Ht 5\' 10"  (1.778 m)  Wt 130 lb (58.968 kg)  BMI 18.65 kg/m2  SpO2 96%  LMP 01/06/2015 Physical Exam  Constitutional: She appears well-developed and well-nourished. No distress.  HENT:  Head: Normocephalic and atraumatic.  Mouth/Throat: Oropharynx is clear and moist. No oropharyngeal exudate.  Eyes: Conjunctivae and EOM are normal. Pupils are equal, round, and reactive to light. Right eye exhibits no discharge. Left eye exhibits no discharge. No  scleral icterus.  Neck: Normal range of motion. Neck supple. No JVD present. No thyromegaly present.  Cardiovascular: Normal rate, regular rhythm, normal heart sounds and intact distal pulses.  Exam reveals no gallop and no friction rub.   No murmur heard. Pulmonary/Chest: Effort normal and breath sounds normal. No respiratory distress. She has no wheezes. She has no rales.  Abdominal: Soft. Bowel sounds are normal. She exhibits no distension and no mass. There is no tenderness.  Genitourinary:  Chaperone present for exam,large amount of blood in vault -  no ttp  Musculoskeletal: Normal range of motion. She exhibits no edema or tenderness.  Lymphadenopathy:    She has no cervical adenopathy.  Neurological: She is alert. Coordination normal.  Skin: Skin is warm and dry. No rash noted. No erythema.  Psychiatric: She has a normal mood and affect. Her behavior is normal.  Nursing note and vitals reviewed.   ED Course  Procedures (including critical care time) Labs Review Labs Reviewed  BASIC METABOLIC PANEL - Abnormal; Notable for the following:    Glucose, Bld 104 (*)    All other components within normal limits  CBC - Abnormal; Notable for the following:    RBC 3.58 (*)    Hemoglobin 7.0 (*)    HCT 24.3 (*)    MCV 67.9 (*)    MCH 19.6 (*)    MCHC 28.8 (*)    RDW 17.0 (*)    All other components within normal limits  URINALYSIS, ROUTINE W REFLEX MICROSCOPIC (NOT AT [facility]) - Abnormal; Notable for the following:    Color, Urine RED (*)    APPearance TURBID (*)    Hgb urine dipstick LARGE (*)    Ketones, ur 15 (*)    Protein, ur >300 (*)    Leukocytes, UA SMALL (*)    All other components within normal limits  URINE MICROSCOPIC-ADD ON - Abnormal; Notable for the following:    Squamous Epithelial / LPF 6-30 (*)    Bacteria, UA MANY (*)    All other components within normal limits  WET PREP, GENITAL  PROTIME-INR  APTT  I-STAT BETA HCG BLOOD, ED ([facility], [facility], [facility] ONLY)  GC/CHLAMYDIA PROBE AMP ([place]) NOT AT [facility]    Imaging Review No results found. I have personally reviewed and evaluated these images and lab results as part of my medical decision-making.    MDM   Final diagnoses:  Vaginal bleeding  Anemia, unspecified anemia type    D/w Dr. Nehemiah Settle who will arrange outpt f/u - recommended megace - pt informed and is in agreement.  Pt agreeable - is not symptomatic from anemia - stable appearing.   Noemi Chapel, MD 01/19/15 [id number]  Noemi Chapel, MD 01/20/15 [phone]

## 2015-01-21 MED FILL — MEGESTROL 40 MG TABLET: 40 | 8 days supply | Qty: 30 | Fill #0

## 2015-01-22 ENCOUNTER — Encounter: Payer: Self-pay | Admitting: Family Medicine

## 2015-01-27 ENCOUNTER — Ambulatory Visit: Payer: Self-pay | Admitting: Internal Medicine

## 2015-02-11 MED FILL — MEGESTROL 40 MG TABLET: 40 | 8 days supply | Qty: 30 | Fill #1

## 2015-02-13 ENCOUNTER — Ambulatory Visit: Payer: Self-pay | Admitting: Internal Medicine

## 2015-04-02 ENCOUNTER — Ambulatory Visit: Payer: Self-pay | Admitting: Obstetrics & Gynecology

## 2016-11-28 ENCOUNTER — Emergency Department (HOSPITAL_COMMUNITY)
Admission: EM | Admit: 2016-11-28 | Discharge: 2016-11-28 | Disposition: A | Discharge status: Home / Self Care | Payer: Medicaid Other | Attending: Emergency Medicine | Admitting: Emergency Medicine

## 2016-11-28 ENCOUNTER — Other Ambulatory Visit: Payer: Self-pay

## 2016-11-28 DIAGNOSIS — K0889 Other specified disorders of teeth and supporting structures: Secondary | ICD-10-CM | POA: Diagnosis present

## 2016-11-28 DIAGNOSIS — Z79899 Other long term (current) drug therapy: Secondary | ICD-10-CM | POA: Insufficient documentation

## 2016-11-28 DIAGNOSIS — D649 Anemia, unspecified: Secondary | ICD-10-CM | POA: Insufficient documentation

## 2016-11-28 MED ORDER — IBUPROFEN 800 MG PO TABS: 800.0000 mg | ORAL_TABLET | Freq: Three times a day (TID) | ORAL | 0 refills | Status: DC

## 2016-11-28 MED ORDER — IBUPROFEN 800 MG PO TABS
800.0000 mg | ORAL_TABLET | Freq: Once | ORAL | Status: AC
  Administered 2016-11-28: 800 mg via ORAL
  Filled 2016-11-28: qty 1

## 2016-11-28 NOTE — Discharge Instructions (Signed)
Please read attached information. If you experience any new or worsening signs or symptoms please return to the emergency room for evaluation. Please follow-up with your primary care provider or specialist as discussed. Please use medication prescribed only as directed and discontinue taking if you have any concerning signs or symptoms.   °

## 2016-11-28 NOTE — ED Provider Notes (Signed)
[facility] DEPT Provider Note   CSN: [id number] Arrival date & time: 11/28/16  1416     History   Chief Complaint Chief Complaint  Patient presents with  . Dental Pain    HPI Lindsey Simpson is a 51 y.o. female.  HPI   51 year old female presents today with complaints of dental pain.  Patient notes a significant past medical history of the same.  She notes all of her teeth have problems, noting that her left lower dentition has been hurting over the last several days.  She notes this is normal for the patient, but reports this has sustained longer than usual.  She notes radiation to her ear again this is normal.  She denies any swelling or edema, denies any discharge.  Denies any fever.  No other complaints here today.  No significant upper respiratory complaints.  Past Medical History:  Diagnosis Date  . Anemia     Patient Active Problem List   Diagnosis Date Noted  . Fibroids, submucosal 02/05/2014  . Symptomatic anemia 12/22/2013  . Vaginal bleeding 12/22/2013  . Hypotension 12/22/2013  . UTI (urinary tract infection) 12/22/2013    Past Surgical History:  Procedure Laterality Date  . TUBAL LIGATION      OB History    Gravida Para Term Preterm AB Living   6 5 5  0 1 5   SAB TAB Ectopic Multiple Live Births   1 0 0 0         Home Medications    Prior to Admission medications   Medication Sig Start Date End Date Taking? Authorizing Provider  docusate sodium (COLACE) 100 MG capsule Take 1 capsule (100 mg total) by mouth 2 (two) times daily. Patient not taking: Reported on 12/25/2013 12/23/13   Dellinger, Bobby Rumpf, PA-C  ferrous sulfate 325 (65 FE) MG tablet Take 1 tablet (325 mg total) by mouth 2 (two) times daily with a meal. 12/25/13   Lance Bosch, NP  ibuprofen (ADVIL,MOTRIN) 800 MG tablet Take 1 tablet (800 mg total) 3 (three) times daily by mouth. 11/28/16   Kal Chait, Dellis Filbert, PA-C  megestrol (MEGACE) 40 MG tablet Take 2  tablets (80 mg total) by mouth 2 (two) times daily. Take twice daily until bleeding stops - consult with your Gynecologist before starting this medicine 01/19/15   Noemi Chapel, MD    Family History Family History  Problem Relation Age of Onset  . Cancer Mother   . Cancer Father   . Diabetes Father     Social History Social History   Tobacco Use  . Smoking status: Never Smoker  . Smokeless tobacco: Never Used  Substance Use Topics  . Alcohol use: Yes    Comment: occassional  . Drug use: No     Allergies   Patient has no known allergies.   Review of Systems Review of Systems  All other systems reviewed and are negative.    Physical Exam Updated Vital Signs BP (!) 142/78 (BP Location: Left Arm)   Pulse 70   Temp 98.2 F (36.8 C) (Oral)   Resp 18   Ht 5\' 10"  (1.778 m)   Wt 54.4 kg (120 lb)   SpO2 100%   BMI 17.22 kg/m   Physical Exam  Constitutional: She is oriented to person, place, and time. She appears well-developed and well-nourished.  HENT:  Head: Normocephalic and atraumatic.  Numerous missing teeth and dental caries throughout, gumline palpated throughout with, no signs of infectious etiology-no  active range of motion of the jaw  Right sided cerumen impaction, left-sided narrow external auditory canal without swelling or edema, no signs of acute otitis media  Eyes: Conjunctivae are normal. Pupils are equal, round, and reactive to light. Right eye exhibits no discharge. Left eye exhibits no discharge. No scleral icterus.  Neck: Normal range of motion. No JVD present. No tracheal deviation present.  Pulmonary/Chest: Effort normal. No stridor.  Neurological: She is alert and oriented to person, place, and time. Coordination normal.  Psychiatric: She has a normal mood and affect. Her behavior is normal. Judgment and thought content normal.  Nursing note and vitals reviewed.    ED Treatments / Results  Labs (all labs ordered are listed, but only  abnormal results are displayed) Labs Reviewed - No data to display  EKG  EKG Interpretation None       Radiology No results found.  Procedures Procedures (including critical care time)  Medications Ordered in ED Medications  ibuprofen (ADVIL,MOTRIN) tablet 800 mg (not administered)     Initial Impression / Assessment and Plan / ED Course  I have reviewed the triage vital signs and the nursing notes.  Pertinent labs & imaging results that were available during my care of the patient were reviewed by me and considered in my medical decision making (see chart for details).      Final Clinical Impressions(s) / ED Diagnoses   Final diagnoses:  Pain, dental    Labs:   Imaging:  Consults:  Therapeutics: Ibuprofen  Discharge Meds: Ibuprofen  Assessment/Plan:  51 year old female presents today with uncomplicated dental pain.  She has no signs of infectious etiology on exam.  Patient will referred to dentist for ongoing evaluation and management.  She is given strict return precautions, verbalized understanding and agreement to today's plan.  ED Discharge Orders        Ordered    ibuprofen (ADVIL,MOTRIN) 800 MG tablet  3 times daily     11/28/16 1456       Okey Regal, PA-C 11/28/16 1457    Tanna Furry, MD 12/04/16 2302

## 2016-11-28 NOTE — ED Triage Notes (Signed)
Pt arrive via EMS from home c/o left side lower dental pain that radiates to  left side ear pain 10/10 thorbbing. Pt denies any balance problems.    V/S 170/90 HR  76 RR 18

## 2019-02-21 ENCOUNTER — Emergency Department (HOSPITAL_COMMUNITY): Payer: Self-pay

## 2019-02-21 ENCOUNTER — Encounter (HOSPITAL_COMMUNITY): Payer: Self-pay

## 2019-02-21 ENCOUNTER — Other Ambulatory Visit: Payer: Self-pay

## 2019-02-21 ENCOUNTER — Emergency Department (HOSPITAL_COMMUNITY)
Admission: EM | Admit: 2019-02-21 | Discharge: 2019-02-21 | Disposition: A | Discharge status: Home / Self Care | Payer: Self-pay | Attending: Emergency Medicine | Admitting: Emergency Medicine

## 2019-02-21 DIAGNOSIS — Z20822 Contact with and (suspected) exposure to covid-19: Secondary | ICD-10-CM | POA: Insufficient documentation

## 2019-02-21 DIAGNOSIS — R197 Diarrhea, unspecified: Secondary | ICD-10-CM | POA: Insufficient documentation

## 2019-02-21 DIAGNOSIS — R112 Nausea with vomiting, unspecified: Secondary | ICD-10-CM | POA: Insufficient documentation

## 2019-02-21 DIAGNOSIS — R1084 Generalized abdominal pain: Secondary | ICD-10-CM | POA: Insufficient documentation

## 2019-02-21 LAB — CBC WITH DIFFERENTIAL/PLATELET
Abs Immature Granulocytes: 0.01 10*3/uL (ref 0.00–0.07)
Basophils Absolute: 0 10*3/uL (ref 0.0–0.1)
Basophils Relative: 1 %
Eosinophils Absolute: 0.2 10*3/uL (ref 0.0–0.5)
Eosinophils Relative: 3 %
HCT: 34.8 % — ABNORMAL LOW (ref 36.0–46.0)
Hemoglobin: 9.8 g/dL — ABNORMAL LOW (ref 12.0–15.0)
Immature Granulocytes: 0 %
Lymphocytes Relative: 10 %
Lymphs Abs: 0.6 10*3/uL — ABNORMAL LOW (ref 0.7–4.0)
MCH: 20.3 pg — ABNORMAL LOW (ref 26.0–34.0)
MCHC: 28.2 g/dL — ABNORMAL LOW (ref 30.0–36.0)
MCV: 72.2 fL — ABNORMAL LOW (ref 80.0–100.0)
Monocytes Absolute: 0.5 10*3/uL (ref 0.1–1.0)
Monocytes Relative: 8 %
Neutro Abs: 4.9 10*3/uL (ref 1.7–7.7)
Neutrophils Relative %: 78 %
Platelets: 373 10*3/uL (ref 150–400)
RBC: 4.82 MIL/uL (ref 3.87–5.11)
RDW: 20.8 % — ABNORMAL HIGH (ref 11.5–15.5)
WBC: 6.3 10*3/uL (ref 4.0–10.5)
nRBC: 0 % (ref 0.0–0.2)

## 2019-02-21 LAB — COMPREHENSIVE METABOLIC PANEL
ALT: 15 U/L (ref 0–44)
AST: 21 U/L (ref 15–41)
Albumin: 3.8 g/dL (ref 3.5–5.0)
Alkaline Phosphatase: 81 U/L (ref 38–126)
Anion gap: 10 (ref 5–15)
BUN: 18 mg/dL (ref 6–20)
CO2: 23 mmol/L (ref 22–32)
Calcium: 9.1 mg/dL (ref 8.9–10.3)
Chloride: 105 mmol/L (ref 98–111)
Creatinine, Ser: 0.61 mg/dL (ref 0.44–1.00)
GFR calc Af Amer: >60 mL/min (ref >60)
GFR calc non Af Amer: >60 mL/min (ref >60)
Glucose, Bld: 97 mg/dL (ref 70–99)
Potassium: 3.5 mmol/L (ref 3.5–5.1)
Sodium: 138 mmol/L (ref 135–145)
Total Bilirubin: 0.5 mg/dL (ref 0.3–1.2)
Total Protein: 8 g/dL (ref 6.5–8.1)

## 2019-02-21 LAB — URINALYSIS, ROUTINE W REFLEX MICROSCOPIC
Bilirubin Urine: NEGATIVE
Glucose, UA: NEGATIVE mg/dL
Hgb urine dipstick: NEGATIVE
Ketones, ur: 5 mg/dL — AB
Leukocytes,Ua: NEGATIVE
Nitrite: NEGATIVE
Protein, ur: NEGATIVE mg/dL
Specific Gravity, Urine: 1.038 — ABNORMAL HIGH (ref 1.005–1.030)
pH: 7 (ref 5.0–8.0)

## 2019-02-21 LAB — RESPIRATORY PANEL BY RT PCR (FLU A&B, COVID)
Influenza A by PCR: NEGATIVE
Influenza B by PCR: NEGATIVE
SARS Coronavirus 2 by RT PCR: NEGATIVE

## 2019-02-21 LAB — LIPASE, BLOOD: Lipase: 20 U/L (ref 11–51)

## 2019-02-21 MED ORDER — HYDROMORPHONE HCL 1 MG/ML IJ SOLN
0.5000 mg | Freq: Once | INTRAMUSCULAR | Status: AC
  Administered 2019-02-21: 12:00:00 0.5 mg via INTRAVENOUS
  Filled 2019-02-21: qty 1

## 2019-02-21 MED ORDER — IOHEXOL 300 MG/ML  SOLN
100.0000 mL | Freq: Once | INTRAMUSCULAR | Status: AC | PRN
  Administered 2019-02-21: 100 mL via INTRAVENOUS

## 2019-02-21 MED ORDER — ONDANSETRON 4 MG PO TBDP: 4.0000 mg | ORAL_TABLET | Freq: Three times a day (TID) | ORAL | 0 refills | Status: DC | PRN

## 2019-02-21 MED ORDER — SODIUM CHLORIDE 0.9 % IV BOLUS
1000.0000 mL | Freq: Once | INTRAVENOUS | Status: AC
  Administered 2019-02-21: 1000 mL via INTRAVENOUS

## 2019-02-21 MED ORDER — ONDANSETRON HCL 4 MG/2ML IJ SOLN
4.0000 mg | Freq: Once | INTRAMUSCULAR | Status: AC
  Administered 2019-02-21: 4 mg via INTRAVENOUS
  Filled 2019-02-21: qty 2

## 2019-02-21 NOTE — ED Triage Notes (Signed)
Pt bib [facility] from home for n/v x 2 hours. Pt also reports upper back pain and abdominal pain x 3 days. VSS w/ EMS. Pt states no pertinent medical hx.

## 2019-02-21 NOTE — Discharge Instructions (Addendum)
Clear liquid diet, advance to bland diet slowly as tolerated.  Return to ER for new or worsening symptoms otherwise follow-up with your primary care provider. Take Zofran as needed as prescribed for nausea and vomiting.

## 2019-02-21 NOTE — ED Provider Notes (Signed)
Lindsey Simpson   CSN: [id number] Arrival date & time: 02/21/19  1100     History Chief Complaint  Patient presents with  . Nausea    Lindsey Simpson is a 54 y.o. female.  54 year old female with past medical history of anemia presents with complaint of periumbilical abdominal pain, back pain, nausea and vomiting.  Patient reports onset of symptoms 1 week ago with diarrhea, runny nose, back pain, now with the abdominal pain, nausea or vomiting.  Patient denies fevers, chills, changes in bladder habits.  No known sick contacts.  No history of similar pain previously.  Patient does report drinking 4 beers and one gin and juice last night, no history of pancreatitis.  Prior abdominal surgery includes tubal ligation.  Lindsey Simpson was evaluated in Emergency Department on 02/21/2019 for the symptoms described in the history of present illness. She was evaluated in the context of the global COVID-19 pandemic, which necessitated consideration that the patient might be at risk for infection with the SARS-CoV-2 virus that causes COVID-19. Institutional protocols and algorithms that pertain to the evaluation of patients at risk for COVID-19 are in a state of rapid change based on information released by regulatory bodies including the CDC and federal and state organizations. These policies and algorithms were followed during the patient's care in the ED.         Past Medical History:  Diagnosis Date  . Anemia     Patient Active Problem List   Diagnosis Date Noted  . Fibroids, submucosal 02/05/2014  . Symptomatic anemia 12/22/2013  . Vaginal bleeding 12/22/2013  . Hypotension 12/22/2013  . UTI (urinary tract infection) 12/22/2013    Past Surgical History:  Procedure Laterality Date  . TUBAL LIGATION       OB History    Gravida  6   Para  5   Term  5   Preterm  0   AB  1   Living  5     SAB  1   TAB  0   Ectopic    0   Multiple  0   Live Births              Family History  Problem Relation Age of Onset  . Cancer Mother   . Cancer Father   . Diabetes Father     Social History   Tobacco Use  . Smoking status: Never Smoker  . Smokeless tobacco: Never Used  Substance Use Topics  . Alcohol use: Yes    Comment: occassional  . Drug use: No    Home Medications Prior to Admission medications   Medication Sig Start Date End Date Taking? Authorizing Provider  docusate sodium (COLACE) 100 MG capsule Take 1 capsule (100 mg total) by mouth 2 (two) times daily. Patient not taking: Reported on 12/25/2013 12/23/13   Dellinger, Bobby Rumpf, PA-C  ferrous sulfate 325 (65 FE) MG tablet Take 1 tablet (325 mg total) by mouth 2 (two) times daily with a meal. 12/25/13   Lance Bosch, NP  ibuprofen (ADVIL,MOTRIN) 800 MG tablet Take 1 tablet (800 mg total) 3 (three) times daily by mouth. 11/28/16   Hedges, Dellis Filbert, PA-C  megestrol (MEGACE) 40 MG tablet Take 2 tablets (80 mg total) by mouth 2 (two) times daily. Take twice daily until bleeding stops - consult with your Gynecologist before starting this medicine 01/19/15   Noemi Chapel, MD  ondansetron (ZOFRAN ODT) 4 MG disintegrating  tablet Take 1 tablet (4 mg total) by mouth every 8 (eight) hours as needed for nausea or vomiting. 02/21/19   Tacy Learn, PA-C    Allergies    Patient has no known allergies.  Review of Systems   Review of Systems  Constitutional: Negative for chills, diaphoresis and fever.  Respiratory: Negative for shortness of breath.   Cardiovascular: Negative for chest pain.  Gastrointestinal: Positive for abdominal pain, diarrhea, nausea and vomiting. Negative for constipation.  Genitourinary: Negative for difficulty urinating and dysuria.  Musculoskeletal: Positive for back pain.  Skin: Negative for rash and wound.  Allergic/Immunologic: Negative for immunocompromised state.  Neurological: Negative for weakness.  Hematological:  Negative for adenopathy.  Psychiatric/Behavioral: Negative for confusion.  All other systems reviewed and are negative.   Physical Exam Updated Vital Signs BP (!) 161/80   Pulse 67   Temp 98.2 F (36.8 C) (Oral)   Resp 12   Ht 5\' 11"  (1.803 m)   Wt 67.1 kg   SpO2 97%   BMI 20.64 kg/m   Physical Exam Vitals and nursing Simpson reviewed.  Constitutional:      General: She is not in acute distress.    Appearance: She is well-developed. She is not diaphoretic.  HENT:     Head: Normocephalic and atraumatic.  Cardiovascular:     Rate and Rhythm: Normal rate and regular rhythm.     Pulses: Normal pulses.     Heart sounds: Normal heart sounds.  Pulmonary:     Effort: Pulmonary effort is normal.     Breath sounds: Normal breath sounds.  Abdominal:     Palpations: Abdomen is soft.     Tenderness: There is generalized abdominal tenderness.  Musculoskeletal:     Right lower leg: No edema.     Left lower leg: No edema.  Skin:    General: Skin is warm and dry.     Findings: No lesion or rash.  Neurological:     Mental Status: She is alert and oriented to person, place, and time.  Psychiatric:        Behavior: Behavior normal.     ED Results / Procedures / Treatments   Labs (all labs ordered are listed, but only abnormal results are displayed) Labs Reviewed  CBC WITH DIFFERENTIAL/PLATELET - Abnormal; Notable for the following components:      Result Value   Hemoglobin 9.8 (*)    HCT 34.8 (*)    MCV 72.2 (*)    MCH 20.3 (*)    MCHC 28.2 (*)    RDW 20.8 (*)    Lymphs Abs 0.6 (*)    All other components within normal limits  RESPIRATORY PANEL BY RT PCR (FLU A&B, COVID)  COMPREHENSIVE METABOLIC PANEL  LIPASE, BLOOD  URINALYSIS, ROUTINE W REFLEX MICROSCOPIC    EKG EKG Interpretation  Date/Time:  Thursday February 21 2019 11:25:28 EST Ventricular Rate:  62 PR Interval:    QRS Duration: 115 QT Interval:  451 QTC Calculation: 458 R Axis:   72 Text  Interpretation: Sinus rhythm Left ventricular hypertrophy Anterior Q waves, possibly due to LVH No old tracing to compare Confirmed by Sherwood Gambler [phone]) on 02/21/2019 12:08:32 PM   Radiology CT Abdomen Pelvis W Contrast  Result Date: 02/21/2019 CLINICAL DATA:  Acute generalized abdominal pain. EXAM: CT ABDOMEN AND PELVIS WITH CONTRAST TECHNIQUE: Multidetector CT imaging of the abdomen and pelvis was performed using the standard protocol following bolus administration of intravenous contrast. CONTRAST:  179mL OMNIPAQUE  IOHEXOL 300 MG/ML  SOLN COMPARISON:  None. FINDINGS: Lower chest: No acute abnormality. Hepatobiliary: No focal liver abnormality is seen. No gallstones, gallbladder wall thickening, or biliary dilatation. Pancreas: Unremarkable. No pancreatic ductal dilatation or surrounding inflammatory changes. Spleen: Normal in size without focal abnormality. Adrenals/Urinary Tract: Adrenal glands are unremarkable. Kidneys are normal, without renal calculi, focal lesion, or hydronephrosis. Bladder is unremarkable. Stomach/Bowel: Stomach is within normal limits. Appendix appears normal. No evidence of bowel wall thickening, distention, or inflammatory changes. Vascular/Lymphatic: No significant vascular findings are present. No enlarged abdominal or pelvic lymph nodes. Reproductive: Large heterogeneous mass is noted in the uterus which measures 5.7 x 5.1 cm and most likely represents uterine fibroid. No adnexal abnormality is noted. Other: No abdominal wall hernia or abnormality. No abdominopelvic ascites. Musculoskeletal: No acute or significant osseous findings. IMPRESSION: Large heterogeneous abnormality measuring 5.7 x 5.1 cm is noted within the uterus most consistent with uterine fibroid, but pelvic ultrasound is recommended for further evaluation. No other abnormality seen in the abdomen or pelvis. Electronically Signed   By: Marijo Conception M.D.   On: 02/21/2019 15:30    Procedures Procedures  (including critical care time)  Medications Ordered in ED Medications  sodium chloride 0.9 % bolus 1,000 mL (1,000 mLs Intravenous New Bag/Given 02/21/19 1154)  HYDROmorphone (DILAUDID) injection 0.5 mg (0.5 mg Intravenous Given 02/21/19 1157)  ondansetron (ZOFRAN) injection 4 mg (4 mg Intravenous Given 02/21/19 1156)  iohexol (OMNIPAQUE) 300 MG/ML solution 100 mL (100 mLs Intravenous Contrast Given 02/21/19 1521)    ED Course  I have reviewed the triage vital signs and the nursing notes.  Pertinent labs & imaging results that were available during my care of the patient were reviewed by me and considered in my medical decision making (see chart for details).  Clinical Course as of Feb 21 1547  Thu Feb 20, 6165  460 54 year old female with complaint of nausea, vomiting, abdominal pain, also reports diarrhea and runny nose.  Patient does report heavy alcohol use last night, no prior history of pancreatitis.  On exam patient has diffuse abdominal tenderness.  Patient was given IV pain medication and IV fluids, is feeling better at this time.  Review of lab work shows a normal CMP, CBC with no baseline anemia with hemoglobin of 9.8.Covid PCR negative for flu/Covid. CT abdomen pelvis is negative with exception of an approximate 6 cm abnormality in the uterus.  Review of prior records, ultrasound from 2015 shows large uterine fibroid.  Discussed with patient, she is aware of this.  Patient will be discharged to follow-up with her PCP.   [LM]    Clinical Course User Index [LM] Roque Lias   MDM Rules/Calculators/A&P                      Final Clinical Impression(s) / ED Diagnoses Final diagnoses:  Generalized abdominal pain  Non-intractable vomiting with nausea, unspecified vomiting type    Rx / DC Orders ED Discharge Orders         Ordered    ondansetron (ZOFRAN ODT) 4 MG disintegrating tablet  Every 8 hours PRN     02/21/19 1549           Tacy Learn, PA-C 02/21/19  1549    Sherwood Gambler, MD 02/23/19 1201

## 2019-10-17 ENCOUNTER — Other Ambulatory Visit: Payer: Medicaid Other

## 2019-10-17 DIAGNOSIS — Z20822 Contact with and (suspected) exposure to covid-19: Secondary | ICD-10-CM

## 2019-10-18 LAB — NOVEL CORONAVIRUS, NAA: SARS-CoV-2, NAA: NOT DETECTED

## 2019-10-18 LAB — SARS-COV-2, NAA 2 DAY TAT

## 2020-01-13 ENCOUNTER — Other Ambulatory Visit: Payer: Self-pay

## 2020-01-13 ENCOUNTER — Emergency Department (HOSPITAL_COMMUNITY)
Admission: EM | Admit: 2020-01-13 | Discharge: 2020-01-14 | Disposition: A | Discharge status: Home / Self Care | Payer: HRSA Program | Attending: Emergency Medicine | Admitting: Emergency Medicine

## 2020-01-13 DIAGNOSIS — U071 COVID-19: Secondary | ICD-10-CM | POA: Diagnosis not present

## 2020-01-13 DIAGNOSIS — Z5321 Procedure and treatment not carried out due to patient leaving prior to being seen by health care provider: Secondary | ICD-10-CM | POA: Diagnosis not present

## 2020-01-13 DIAGNOSIS — M791 Myalgia, unspecified site: Secondary | ICD-10-CM | POA: Diagnosis not present

## 2020-01-13 DIAGNOSIS — R531 Weakness: Secondary | ICD-10-CM | POA: Diagnosis present

## 2020-01-13 LAB — RESP PANEL BY RT-PCR (FLU A&B, COVID) ARPGX2
Influenza A by PCR: NEGATIVE
Influenza B by PCR: NEGATIVE
SARS Coronavirus 2 by RT PCR: POSITIVE — AB

## 2020-01-13 NOTE — ED Triage Notes (Signed)
Pt presents to ED POV. Pt c/o weakness, covid, body aches. Pt partially vaccinated. Pt denies resp s/s. resp e/u

## 2020-01-13 NOTE — ED Notes (Signed)
Pt called several times to room, no answer. 

## 2020-01-14 ENCOUNTER — Telehealth (HOSPITAL_COMMUNITY): Payer: Self-pay

## 2021-08-27 ENCOUNTER — Ambulatory Visit (INDEPENDENT_AMBULATORY_CARE_PROVIDER_SITE_OTHER): Payer: Self-pay

## 2021-08-27 ENCOUNTER — Encounter (HOSPITAL_COMMUNITY): Payer: Self-pay | Admitting: *Deleted

## 2021-08-27 ENCOUNTER — Ambulatory Visit (HOSPITAL_COMMUNITY)
Admission: EM | Admit: 2021-08-27 | Discharge: 2021-08-27 | Disposition: A | Discharge status: Home / Self Care | Payer: Self-pay | Attending: Physician Assistant | Admitting: Physician Assistant

## 2021-08-27 DIAGNOSIS — M542 Cervicalgia: Secondary | ICD-10-CM

## 2021-08-27 DIAGNOSIS — M545 Low back pain, unspecified: Secondary | ICD-10-CM

## 2021-08-27 DIAGNOSIS — M546 Pain in thoracic spine: Secondary | ICD-10-CM

## 2021-08-27 MED ORDER — METHOCARBAMOL 500 MG PO TABS: 500.0000 mg | ORAL_TABLET | Freq: Two times a day (BID) | ORAL | 0 refills | Status: DC

## 2021-08-27 MED ORDER — IBUPROFEN 800 MG PO TABS: 800.0000 mg | ORAL_TABLET | Freq: Three times a day (TID) | ORAL | 0 refills | Status: DC

## 2021-08-27 NOTE — Discharge Instructions (Signed)
Your x-rays showed arthritis throughout your spine but no evidence of a fracture which is great news.  You will probably continue to feel soreness for the next couple of days.  Use ibuprofen for pain relief.  Do not take NSAIDs including aspirin, ibuprofen/Advil, naproxen/Aleve with this medication as it can cause stomach bleeding.  Can use Tylenol for breakthrough pain.  I have also called in a muscle relaxer known as methocarbamol.  This will make you sleepy so do not drive or drink alcohol with taking it.  Use heat and gentle stretch for symptom relief.  If your symptoms do not improve quickly follow-up with sports medicine; call to schedule an appointment.  If you have any worsening symptoms including severe pain, numbness or tingling in your hands or feet, weakness, going to the bathroom yourself without noticing it you need to go to the emergency room immediately.
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
## 2021-09-21 ENCOUNTER — Other Ambulatory Visit: Payer: Self-pay

## 2021-09-21 ENCOUNTER — Encounter (HOSPITAL_COMMUNITY): Payer: Self-pay | Admitting: *Deleted

## 2021-09-21 ENCOUNTER — Ambulatory Visit (HOSPITAL_COMMUNITY)
Admission: EM | Admit: 2021-09-21 | Discharge: 2021-09-21 | Disposition: A | Discharge status: Home / Self Care | Payer: Self-pay | Attending: Family Medicine | Admitting: Family Medicine

## 2021-09-21 ENCOUNTER — Ambulatory Visit (INDEPENDENT_AMBULATORY_CARE_PROVIDER_SITE_OTHER): Payer: Self-pay

## 2021-09-21 DIAGNOSIS — M25512 Pain in left shoulder: Secondary | ICD-10-CM

## 2021-09-21 DIAGNOSIS — M542 Cervicalgia: Secondary | ICD-10-CM

## 2021-09-21 DIAGNOSIS — M25511 Pain in right shoulder: Secondary | ICD-10-CM

## 2021-09-21 MED ORDER — IBUPROFEN 800 MG PO TABS: 800.0000 mg | ORAL_TABLET | Freq: Three times a day (TID) | ORAL | 0 refills | Status: DC

## 2021-09-21 MED ORDER — METHOCARBAMOL 500 MG PO TABS: 500.0000 mg | ORAL_TABLET | Freq: Two times a day (BID) | ORAL | 0 refills | Status: DC

## 2021-09-21 NOTE — ED Provider Notes (Signed)
[place]    CSN: [id number] Arrival date & time: 09/21/21  1230      History   Chief Complaint Chief Complaint  Patient presents with   Arm Pain    HPI Lindsey Simpson is a 56 y.o. female.   Patient is here for bilateral arm pain.  Has been going on for over a month.  Could not really sleep due to pain last night.  She has arthritis of her spine and wants to know if she has arthritis in her arms as well.  She could not really work today due to pain.  At this pint it is mostly in her shoulders and shoulder blades.  Last night it did seem to radiate down the arm.  At times she does have tingling in her fingers.  At times her fingers seem to cramp.  She was seen here 8/11 but never picked up the scripts as she "lost her paper".        Past Medical History:  Diagnosis Date   Anemia     Patient Active Problem List   Diagnosis Date Noted   Fibroids, submucosal 02/05/2014   Symptomatic anemia 12/22/2013   Vaginal bleeding 12/22/2013   Hypotension 12/22/2013   UTI (urinary tract infection) 12/22/2013    Past Surgical History:  Procedure Laterality Date   TUBAL LIGATION      OB History     Gravida  6   Para  5   Term  5   Preterm  0   AB  1   Living  5      SAB  1   IAB  0   Ectopic  0   Multiple  0   Live Births               Home Medications    Prior to Admission medications   Medication Sig Start Date End Date Taking? Authorizing Provider  ibuprofen (ADVIL) 800 MG tablet Take 1 tablet (800 mg total) by mouth 3 (three) times daily. 08/27/21   Raspet, Derry Skill, PA-C  methocarbamol (ROBAXIN) 500 MG tablet Take 1 tablet (500 mg total) by mouth 2 (two) times daily. 08/27/21   Raspet, Derry Skill, PA-C    Family History Family History  Problem Relation Age of Onset   Cancer Mother    Cancer Father    Diabetes Father     Social History Social History   Tobacco Use   Smoking status: Never   Smokeless tobacco: Never   Vaping Use   Vaping Use: Never used  Substance Use Topics   Alcohol use: Yes    Comment: occassional   Drug use: No     Allergies   Patient has no known allergies.   Review of Systems Review of Systems  Constitutional: Negative.   HENT: Negative.    Respiratory: Negative.    Cardiovascular: Negative.   Gastrointestinal: Negative.   Genitourinary: Negative.   Musculoskeletal:  Positive for arthralgias, back pain and neck pain.     Physical Exam Triage Vital Signs ED Triage Vitals  Enc Vitals Group     BP 09/21/21 1320 109/71     Pulse Rate 09/21/21 1320 67     Resp 09/21/21 1320 16     Temp 09/21/21 1320 98 F (36.7 C)     Temp src --      SpO2 09/21/21 1320 100 %     Weight --      Height --  Head Circumference --      Peak Flow --      Pain Score 09/21/21 1317 10     Pain Loc --      Pain Edu? --      Excl. in [place]? --    No data found.  Updated Vital Signs BP 109/71   Pulse 67   Temp 98 F (36.7 C)   Resp 16   LMP 01/06/2015   SpO2 100%   Visual Acuity Right Eye Distance:   Left Eye Distance:   Bilateral Distance:    Right Eye Near:   Left Eye Near:    Bilateral Near:     Physical Exam Constitutional:      Appearance: Normal appearance.  Musculoskeletal:     Comments: TTP to the cervical and thoracic spine;  TTP to the upper and mid paraspinals;  Full rom at the neck without limitation;  Full rom of the shoulders bilaterally, with some complaints of pain with movement;  Normal strength bilaterally  Skin:    General: Skin is warm.  Neurological:     General: No focal deficit present.     Mental Status: She is alert and oriented to person, place, and time.     Motor: No weakness.  Psychiatric:        Mood and Affect: Mood normal.      UC Treatments / Results  Labs (all labs ordered are listed, but only abnormal results are displayed) Labs Reviewed - No data to display  EKG   Radiology DG Shoulder Right  Result Date:  09/21/2021 CLINICAL DATA:  Bilateral shoulder pain without known injury. EXAM: RIGHT SHOULDER - 2+ VIEW COMPARISON:  None Available. FINDINGS: There is no evidence of fracture or dislocation. There is no evidence of arthropathy or other focal bone abnormality. Soft tissues are unremarkable. IMPRESSION: Negative. Electronically Signed   By: Marijo Conception M.D.   On: 09/21/2021 14:37   DG Shoulder Left  Result Date: 09/21/2021 CLINICAL DATA:  Bilateral shoulder pain without known injury. EXAM: LEFT SHOULDER - 2+ VIEW COMPARISON:  None Available. FINDINGS: There is no evidence of fracture or dislocation. Mild degenerative changes seen involving the glenohumeral joint. Soft tissues are unremarkable. IMPRESSION: Mild osteoarthritis of the left glenohumeral joint. No acute abnormality seen. Electronically Signed   By: Marijo Conception M.D.   On: 09/21/2021 14:36    Procedures Procedures (including critical care time)  Medications Ordered in UC Medications - No data to display  Initial Impression / Assessment and Plan / UC Course  I have reviewed the triage vital signs and the nursing notes.  Pertinent labs & imaging results that were available during my care of the patient were reviewed by me and considered in my medical decision making (see chart for details).    Final Clinical Impressions(s) / UC Diagnoses   Final diagnoses:  Acute pain of both shoulders  Neck pain     Discharge Instructions      You were seen today for bilateral shoulder and back pain.  Your xrays show mild arthritis at the left shoulder;  your right shoulder xray was normal.  I have sent out medication for you today to your pharmacy.  You may pick up when able.  Anti-inflammatories like ibuprofen is the treatment for arthritis pain.  I have sent out a muscle relaxer as well to help with the upper back pain.  This may make you sleepy so please take when home and  not driving.  If you continue with pain should make an  appointment with a primary care physician for further discussion.  You may go to [email or web address] to find a provider.     ED Prescriptions     Medication Sig Dispense Auth. Provider   ibuprofen (ADVIL) 800 MG tablet Take 1 tablet (800 mg total) by mouth 3 (three) times daily. 21 tablet Cas Tracz, MD   methocarbamol (ROBAXIN) 500 MG tablet Take 1 tablet (500 mg total) by mouth 2 (two) times daily. 20 tablet Rondel Oh, MD      PDMP not reviewed this encounter.   Rondel Oh, MD 09/21/21 [phone]

## 2021-09-21 NOTE — ED Triage Notes (Signed)
Pt wants to be checked for AR in both arms because her arms hurt. Pt was told she had AR in her back recently.

## 2021-09-21 NOTE — Discharge Instructions (Addendum)
You were seen today for bilateral shoulder and back pain.  Your xrays show mild arthritis at the left shoulder;  your right shoulder xray was normal.  I have sent out medication for you today to your pharmacy.  You may pick up when able.  Anti-inflammatories like ibuprofen is the treatment for arthritis pain.  I have sent out a muscle relaxer as well to help with the upper back pain.  This may make you sleepy so please take when home and not driving.  If you continue with pain should make an appointment with a primary care physician for further discussion.  You may go to [email or web address] to find a provider.

## 2021-10-15 ENCOUNTER — Ambulatory Visit (HOSPITAL_COMMUNITY)
Admission: EM | Admit: 2021-10-15 | Discharge: 2021-10-15 | Disposition: A | Discharge status: Home / Self Care | Payer: 59

## 2021-10-15 ENCOUNTER — Encounter (HOSPITAL_COMMUNITY): Payer: Self-pay

## 2021-10-15 DIAGNOSIS — S1086XA Insect bite of other specified part of neck, initial encounter: Secondary | ICD-10-CM | POA: Diagnosis not present

## 2021-10-15 DIAGNOSIS — W57XXXA Bitten or stung by nonvenomous insect and other nonvenomous arthropods, initial encounter: Secondary | ICD-10-CM

## 2021-10-15 NOTE — ED Triage Notes (Signed)
Pt is here for a insect bite on the left side of the neck causing some itching x1 day

## 2021-10-15 NOTE — ED Provider Notes (Signed)
[place]    CSN: [id number] Arrival date & time: 10/15/21  1919      History   Chief Complaint Chief Complaint  Patient presents with   Insect Bite    HPI Lindsey Simpson is a 56 y.o. female.   56 year old female, Lindsey Simpson, presents to urgent care chief complaint of insect bites to the left side and neck noticed yesterday.  No treatment tried.  Complaining of itching, no streaking no drainage  The history is provided by the patient. No language interpreter was used.    Past Medical History:  Diagnosis Date   Anemia     Patient Active Problem List   Diagnosis Date Noted   Bite, insect 10/15/2021   Fibroids, submucosal 02/05/2014   Symptomatic anemia 12/22/2013   Vaginal bleeding 12/22/2013   Hypotension 12/22/2013   UTI (urinary tract infection) 12/22/2013    Past Surgical History:  Procedure Laterality Date   TUBAL LIGATION      OB History     Gravida  6   Para  5   Term  5   Preterm  0   AB  1   Living  5      SAB  1   IAB  0   Ectopic  0   Multiple  0   Live Births               Home Medications    Prior to Admission medications   Medication Sig Start Date End Date Taking? Authorizing Provider  ibuprofen (ADVIL) 800 MG tablet Take 1 tablet (800 mg total) by mouth 3 (three) times daily. 09/21/21   Piontek, Junie Panning, MD  methocarbamol (ROBAXIN) 500 MG tablet Take 1 tablet (500 mg total) by mouth 2 (two) times daily. 09/21/21   Rondel Oh, MD    Family History Family History  Problem Relation Age of Onset   Cancer Mother    Cancer Father    Diabetes Father     Social History Social History   Tobacco Use   Smoking status: Never   Smokeless tobacco: Never  Vaping Use   Vaping Use: Never used  Substance Use Topics   Alcohol use: Yes    Comment: occassional   Drug use: No     Allergies   Patient has no known allergies.   Review of Systems Review of Systems  Skin:  Positive for color change  and wound.  All other systems reviewed and are negative.    Physical Exam Triage Vital Signs ED Triage Vitals  Enc Vitals Group     BP 10/15/21 1933 96/63     Pulse Rate 10/15/21 1933 73     Resp 10/15/21 1933 12     Temp 10/15/21 1933 98.3 F (36.8 C)     Temp Source 10/15/21 1933 Oral     SpO2 10/15/21 1933 96 %     Weight 10/15/21 1932 135 lb (61.2 kg)     Height 10/15/21 1932 '5\' 10"'$  (1.778 m)     Head Circumference --      Peak Flow --      Pain Score 10/15/21 1932 0     Pain Loc --      Pain Edu? --      Excl. in [place]? --    No data found.  Updated Vital Signs BP 96/63 (BP Location: Right Arm)   Pulse 73   Temp 98.3 F (36.8 C) (Oral)   Resp 12  Ht '5\' 10"'$  (1.778 m)   Wt 135 lb (61.2 kg)   LMP 01/06/2015   SpO2 96%   BMI 19.37 kg/m   Visual Acuity Right Eye Distance:   Left Eye Distance:   Bilateral Distance:    Right Eye Near:   Left Eye Near:    Bilateral Near:     Physical Exam Vitals and nursing note reviewed.  Constitutional:      General: She is not in acute distress.    Appearance: She is well-developed and well-groomed.  HENT:     Head: Normocephalic and atraumatic.  Eyes:     Conjunctiva/sclera: Conjunctivae normal.  Cardiovascular:     Rate and Rhythm: Normal rate and regular rhythm.     Heart sounds: No murmur heard. Pulmonary:     Effort: Pulmonary effort is normal. No respiratory distress.     Breath sounds: Normal breath sounds.  Abdominal:     Palpations: Abdomen is soft.     Tenderness: There is no abdominal tenderness.  Musculoskeletal:        General: No swelling.     Cervical back: Neck supple.  Skin:    General: Skin is warm and dry.     Capillary Refill: Capillary refill takes less than 2 seconds.     Findings: Erythema and wound present.  Neurological:     General: No focal deficit present.     Mental Status: She is alert and oriented to person, place, and time.     GCS: GCS eye subscore is 4. GCS verbal subscore is  5. GCS motor subscore is 6.  Psychiatric:        Mood and Affect: Mood normal.        Behavior: Behavior is cooperative.      UC Treatments / Results  Labs (all labs ordered are listed, but only abnormal results are displayed) Labs Reviewed - No data to display  EKG   Radiology No results found.  Procedures Procedures (including critical care time)  Medications Ordered in UC Medications - No data to display  Initial Impression / Assessment and Plan / UC Course  I have reviewed the triage vital signs and the nursing notes.  Pertinent labs & imaging results that were available during my care of the patient were reviewed by me and considered in my medical decision making (see chart for details).     Ddx: insect bite,local reaction Final Clinical Impressions(s) / UC Diagnoses   Final diagnoses:  Insect bite of other part of neck, initial encounter     Discharge Instructions      May take over-the-counter Benadryl as label directed for your insect bite.  May also apply over-the-counter hydrocortisone cream as label directed to your insect bite.  Do not scratch the area.  Avoid heat and hot water as it makes bites rashes worse.  Follow-up with PCP.     ED Prescriptions   None    PDMP not reviewed this encounter.   Tori Milks, NP 28/36/62 2015

## 2021-10-15 NOTE — Discharge Instructions (Signed)
May take over-the-counter Benadryl as label directed for your insect bite.  May also apply over-the-counter hydrocortisone cream as label directed to your insect bite.  Do not scratch the area.  Avoid heat and hot water as it makes bites rashes worse.  Follow-up with PCP.

## 2021-10-18 ENCOUNTER — Encounter (HOSPITAL_COMMUNITY): Payer: Self-pay | Admitting: Emergency Medicine

## 2021-10-18 ENCOUNTER — Ambulatory Visit (HOSPITAL_COMMUNITY)
Admission: EM | Admit: 2021-10-18 | Discharge: 2021-10-18 | Disposition: A | Discharge status: Home / Self Care | Payer: 59

## 2021-10-18 DIAGNOSIS — H9202 Otalgia, left ear: Secondary | ICD-10-CM

## 2021-10-18 NOTE — ED Triage Notes (Signed)
Pt c/o left ear pain for a couple days.

## 2021-10-18 NOTE — Discharge Instructions (Addendum)
Advised patient on exam bilateral ears are clear and not infected.  Advised patient if symptoms worsen and/or unresolved please follow-up with PCP, ENT or here for further evaluation.

## 2021-10-18 NOTE — ED Provider Notes (Signed)
[place]    CSN: [id number] Arrival date & time: 10/18/21  1713      History   Chief Complaint Chief Complaint  Patient presents with   Otalgia    HPI ARANTZA Simpson is a 56 y.o. female.   HPI 56 year old female presents with left ear pain for 2 days.  PMH significant for frequent UTI, hypotension and symptomatic anemia.  Past Medical History:  Diagnosis Date   Anemia     Patient Active Problem List   Diagnosis Date Noted   Bite, insect 10/15/2021   Fibroids, submucosal 02/05/2014   Symptomatic anemia 12/22/2013   Vaginal bleeding 12/22/2013   Hypotension 12/22/2013   UTI (urinary tract infection) 12/22/2013    Past Surgical History:  Procedure Laterality Date   TUBAL LIGATION      OB History     Gravida  6   Para  5   Term  5   Preterm  0   AB  1   Living  5      SAB  1   IAB  0   Ectopic  0   Multiple  0   Live Births               Home Medications    Prior to Admission medications   Medication Sig Start Date End Date Taking? Authorizing Provider  ibuprofen (ADVIL) 800 MG tablet Take 1 tablet (800 mg total) by mouth 3 (three) times daily. 09/21/21   Piontek, Junie Panning, MD  methocarbamol (ROBAXIN) 500 MG tablet Take 1 tablet (500 mg total) by mouth 2 (two) times daily. 09/21/21   Rondel Oh, MD    Family History Family History  Problem Relation Age of Onset   Cancer Mother    Cancer Father    Diabetes Father     Social History Social History   Tobacco Use   Smoking status: Never   Smokeless tobacco: Never  Vaping Use   Vaping Use: Never used  Substance Use Topics   Alcohol use: Yes    Comment: occassional   Drug use: No     Allergies   Patient has no known allergies.   Review of Systems Review of Systems  HENT:  Positive for ear pain.      Physical Exam Triage Vital Signs ED Triage Vitals  Enc Vitals Group     BP 10/18/21 1742 119/81     Pulse Rate 10/18/21 1742 61     Resp 10/18/21  1742 14     Temp 10/18/21 1742 98.1 F (36.7 C)     Temp src --      SpO2 10/18/21 1742 98 %     Weight --      Height --      Head Circumference --      Peak Flow --      Pain Score 10/18/21 1741 6     Pain Loc --      Pain Edu? --      Excl. in [place]? --    No data found.  Updated Vital Signs BP 119/81 (BP Location: Left Arm)   Pulse 61   Temp 98.1 F (36.7 C)   Resp 14   LMP 01/06/2015   SpO2 98%    Physical Exam Vitals and nursing note reviewed.  Constitutional:      General: She is not in acute distress.    Appearance: Normal appearance. She is normal weight. She is not ill-appearing.  HENT:     Head: Normocephalic and atraumatic.     Right Ear: Tympanic membrane, ear canal and external ear normal.     Left Ear: Tympanic membrane, ear canal and external ear normal.     Mouth/Throat:     Mouth: Mucous membranes are moist.     Pharynx: Oropharynx is clear.  Eyes:     Extraocular Movements: Extraocular movements intact.     Conjunctiva/sclera: Conjunctivae normal.     Pupils: Pupils are equal, round, and reactive to light.  Cardiovascular:     Rate and Rhythm: Normal rate and regular rhythm.     Pulses: Normal pulses.     Heart sounds: Normal heart sounds. No murmur heard. Pulmonary:     Effort: Pulmonary effort is normal.     Breath sounds: Normal breath sounds. No wheezing, rhonchi or rales.  Musculoskeletal:        General: Normal range of motion.     Cervical back: Normal range of motion and neck supple.  Skin:    General: Skin is warm and dry.  Neurological:     General: No focal deficit present.     Mental Status: She is alert and oriented to person, place, and time.      UC Treatments / Results  Labs (all labs ordered are listed, but only abnormal results are displayed) Labs Reviewed - No data to display  EKG   Radiology No results found.  Procedures Procedures (including critical care time)  Medications Ordered in UC Medications - No  data to display  Initial Impression / Assessment and Plan / UC Course  I have reviewed the triage vital signs and the nursing notes.  Pertinent labs & imaging results that were available during my care of the patient were reviewed by me and considered in my medical decision making (see chart for details).     MDM: 1. Otalgia of left ear-on exam left ear is unremarkable within normal limits. Advised patient on exam bilateral ears are clear and not infected.  Advised patient if symptoms worsen and/or unresolved please follow-up with PCP, ENT or here for further evaluation.  Patient discharged home, hemodynamically stable. Final Clinical Impressions(s) / UC Diagnoses   Final diagnoses:  Otalgia of left ear     Discharge Instructions      Advised patient on exam bilateral ears are clear and not infected.  Advised patient if symptoms worsen and/or unresolved please follow-up with PCP, ENT or here for further evaluation.     ED Prescriptions   None    PDMP not reviewed this encounter.   Eliezer Lofts, [place] 10/18/21 1812

## 2021-11-12 IMAGING — CT CT ABD-PELV W/ CM
2 of 5 series · 16 of 46 positions shown, 18 images · IV contrast (Omni 300)
Comparison: None.

CLINICAL DATA: Acute generalized abdominal pain.

EXAM:
CT ABDOMEN AND PELVIS WITH CONTRAST
TECHNIQUE: Multidetector CT imaging of the abdomen and pelvis was performed
using the standard protocol following bolus administration of
intravenous contrast.
CONTRAST:  100mL OMNIPAQUE IOHEXOL 300 MG/ML  SOLN

[Series 2: a/p w/ 5mm · axial · 0.82mm/px · z∈[-498,-108]mm · 13 of 88 slices shown, 15 images]
[im 5/88  soft-tissue]
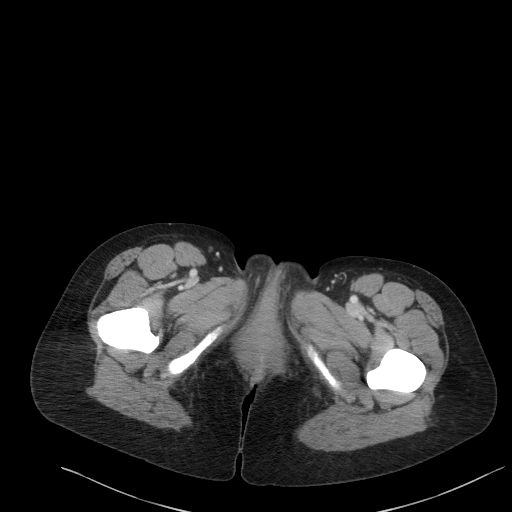
[im 5/88  bone]
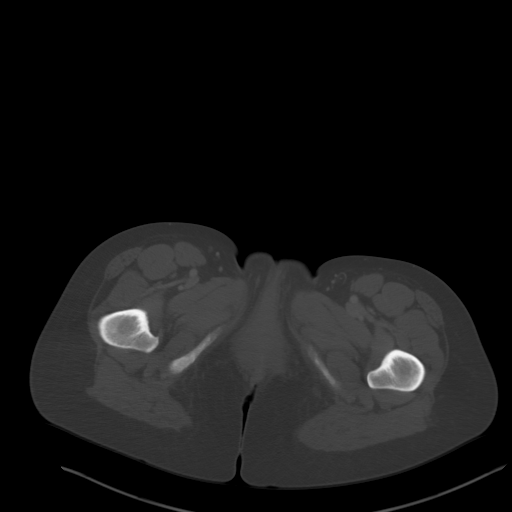
[im 10/88  soft-tissue]
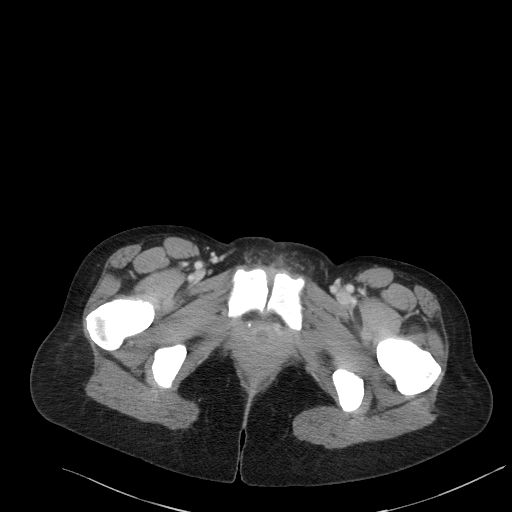
[im 20/88  soft-tissue]
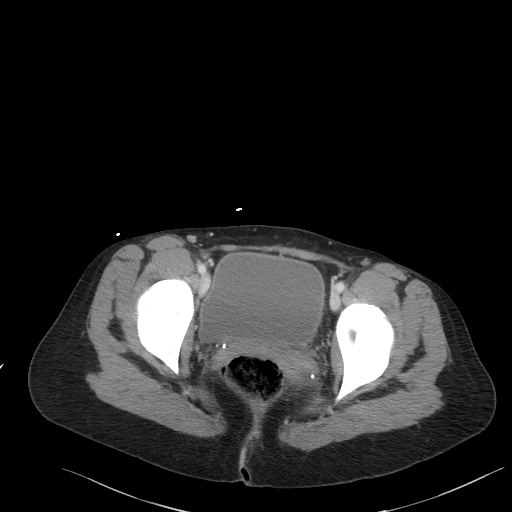
[im 25/88  soft-tissue]
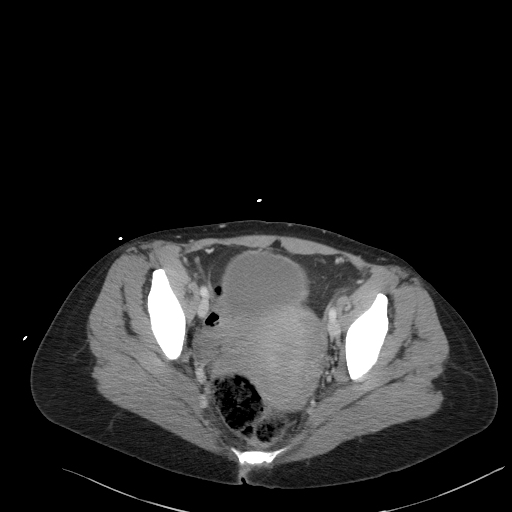
[im 30/88  soft-tissue]
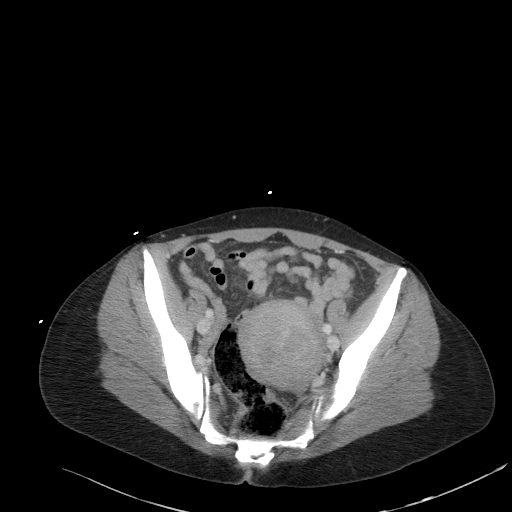
[im 39/88  soft-tissue]
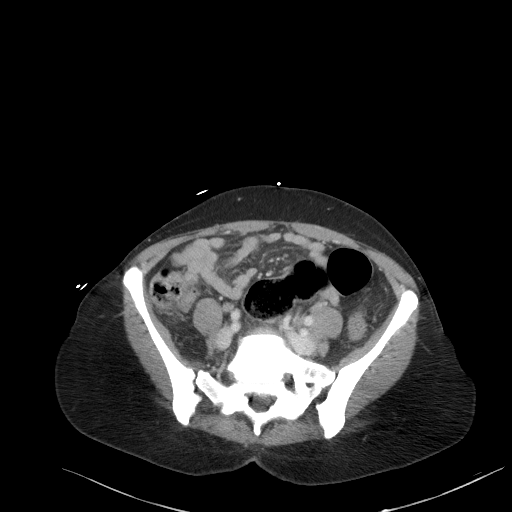
[im 44/88  soft-tissue]
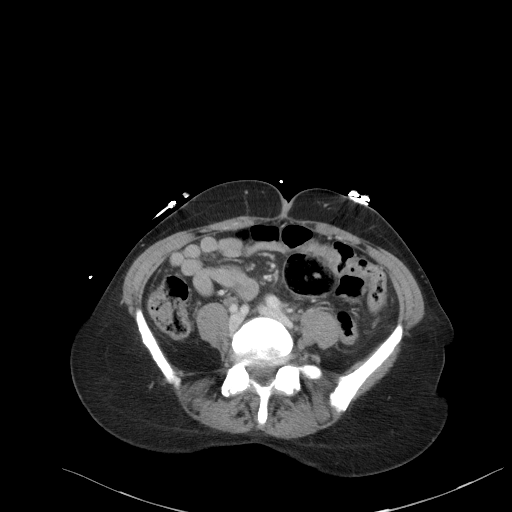
[im 49/88  soft-tissue]
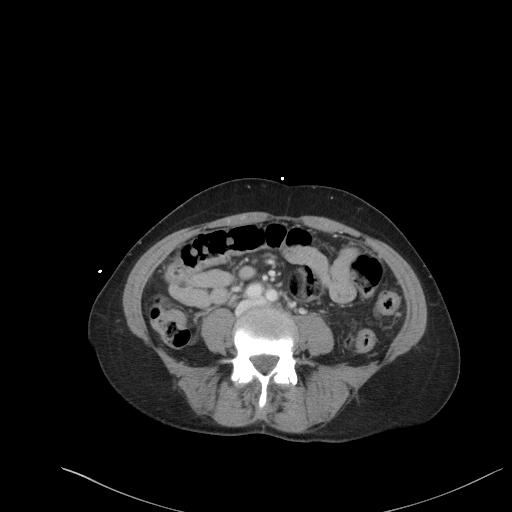
[im 59/88  soft-tissue]
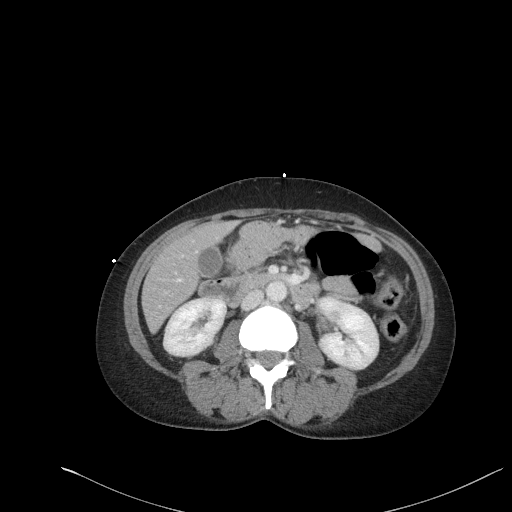
[im 59/88  bone]
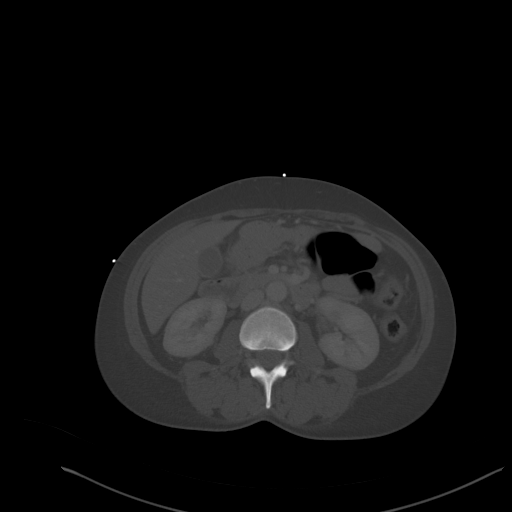
[im 63/88  soft-tissue]
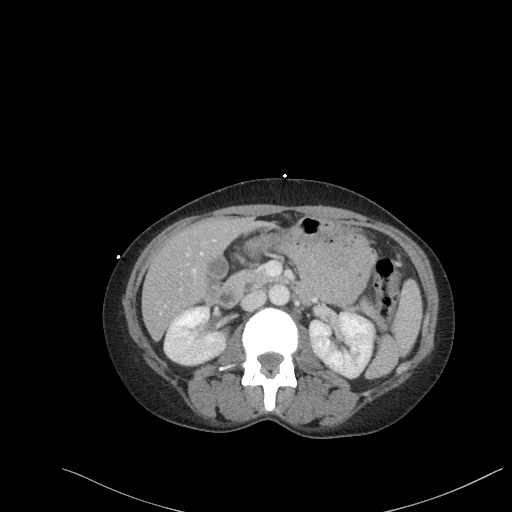
[im 68/88  soft-tissue]
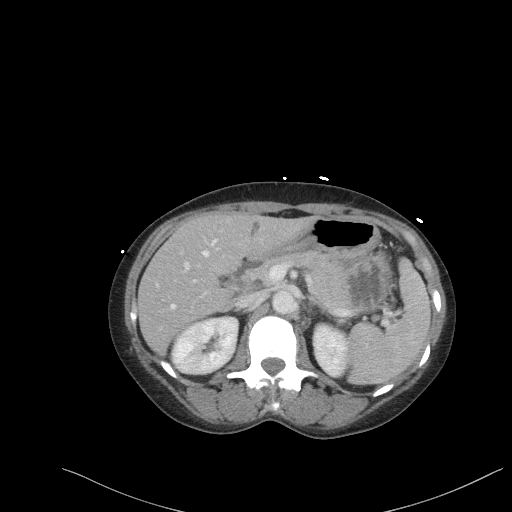
[im 78/88  soft-tissue]
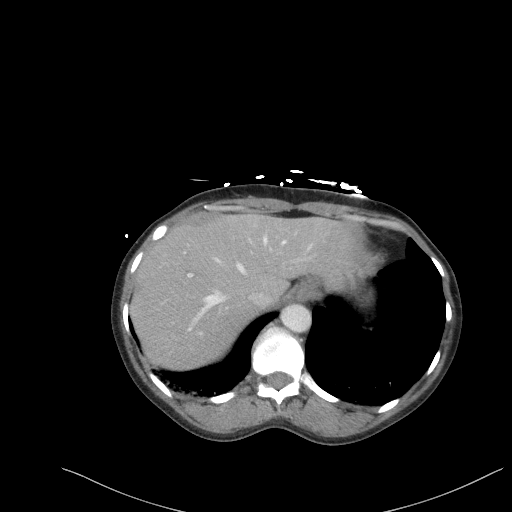
[im 83/88  soft-tissue]
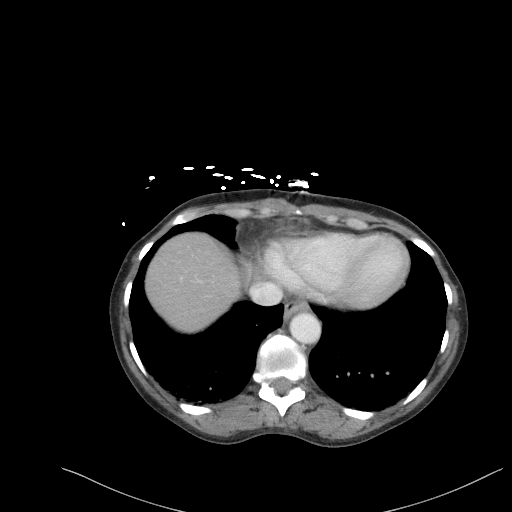

[Series 5: a/p w/ cor · coronal · 0.69mm/px · 3 of 151 slices shown]
[im 51/151  soft-tissue]
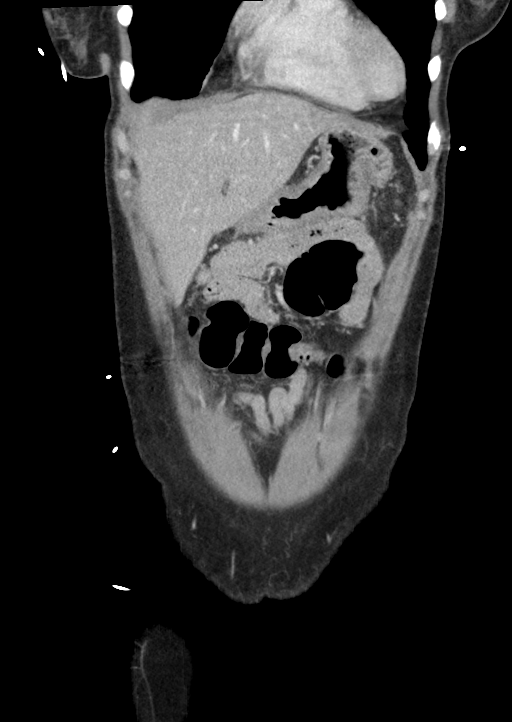
[im 67/151  soft-tissue]
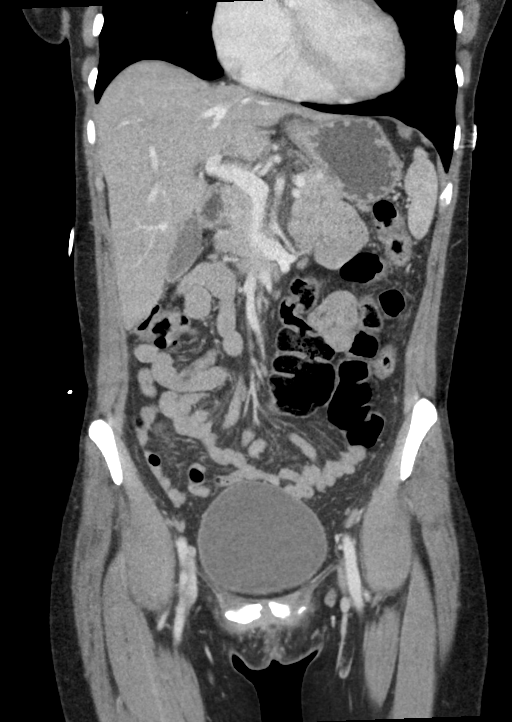
[im 84/151  soft-tissue]
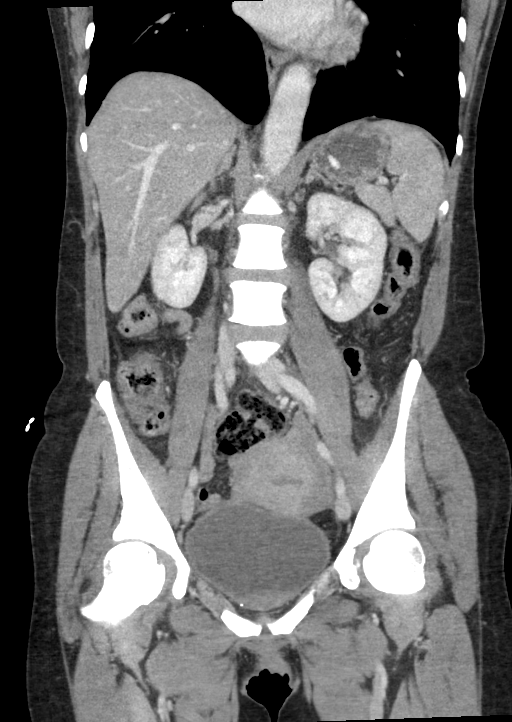

[16 of 46 positions shown; findings below may reference images not displayed]

FINDINGS: Lower chest: No acute abnormality.

Hepatobiliary: No focal liver abnormality is seen. No gallstones,
gallbladder wall thickening, or biliary dilatation.

Pancreas: Unremarkable. No pancreatic ductal dilatation or
surrounding inflammatory changes.

Spleen: Normal in size without focal abnormality.

Adrenals/Urinary Tract: Adrenal glands are unremarkable. Kidneys are
normal, without renal calculi, focal lesion, or hydronephrosis.
Bladder is unremarkable.

Stomach/Bowel: Stomach is within normal limits. Appendix appears
normal. No evidence of bowel wall thickening, distention, or
inflammatory changes.

Vascular/Lymphatic: No significant vascular findings are present. No
enlarged abdominal or pelvic lymph nodes.

Reproductive: Large heterogeneous mass is noted in the uterus which
measures 5.7 x 5.1 cm and most likely represents uterine fibroid. No
adnexal abnormality is noted.

Other: No abdominal wall hernia or abnormality. No abdominopelvic
ascites.

Musculoskeletal: No acute or significant osseous findings.
IMPRESSION: Large heterogeneous abnormality measuring 5.7 x 5.1 cm is noted
within the uterus most consistent with uterine fibroid, but pelvic
ultrasound is recommended for further evaluation.

No other abnormality seen in the abdomen or pelvis.

## 2021-11-14 ENCOUNTER — Ambulatory Visit (HOSPITAL_COMMUNITY)
Admission: EM | Admit: 2021-11-14 | Discharge: 2021-11-14 | Disposition: A | Discharge status: Home / Self Care | Payer: 59 | Attending: Physician Assistant | Admitting: Physician Assistant

## 2021-11-14 ENCOUNTER — Encounter (HOSPITAL_COMMUNITY): Payer: Self-pay | Admitting: Emergency Medicine

## 2021-11-14 ENCOUNTER — Ambulatory Visit (INDEPENDENT_AMBULATORY_CARE_PROVIDER_SITE_OTHER): Payer: 59

## 2021-11-14 DIAGNOSIS — M25571 Pain in right ankle and joints of right foot: Secondary | ICD-10-CM

## 2021-11-14 DIAGNOSIS — S9001XA Contusion of right ankle, initial encounter: Secondary | ICD-10-CM

## 2021-11-14 DIAGNOSIS — S99911A Unspecified injury of right ankle, initial encounter: Secondary | ICD-10-CM | POA: Diagnosis not present

## 2021-11-14 MED ORDER — IBUPROFEN 800 MG PO TABS: 800.0000 mg | ORAL_TABLET | Freq: Three times a day (TID) | ORAL | 0 refills | Status: DC

## 2021-11-14 NOTE — Discharge Instructions (Addendum)
Advised to use ice therapy, 10 minutes on 20 minutes off, 3-4 times throughout the day to help reduce pain and swelling. Advised take ibuprofen 800 mg every 8 hours with food to help reduce pain and swelling. Advised to wear the ankle support when standing up and bearing weight on the ankle as this will help reduce pain and give better stability. Follow-up PCP or return to urgent care if symptoms fail to improve.

## 2021-11-14 NOTE — ED Triage Notes (Signed)
Pt presents with right ankle pain after hitting her ankle bone on a pallet at work. States the incident happened "a couple days ago" but noticed swelling and increased pain yesterday.  Requesting an xray and work note.

## 2021-11-14 NOTE — ED Provider Notes (Signed)
[place]    CSN: [id number] Arrival date & time: 11/14/21  1629      History   Chief Complaint Chief Complaint  Patient presents with   Ankle Pain    HPI Lindsey Simpson is a 56 y.o. female.   79-year-old female presents with right ankle pain.  Patient indicates that 3 days ago while at work she struck her ankle against pallets.  She indicates at the time it did not hurt that bad but over the past 2 days she has had increasing pain, swelling, and pain with walking.  She indicates that the right ankle is swollen.  She indicates she has been using some ice to try to reduce the pain but it is not helped.  She has not taken any OTC medications to relieve the pain.  She denies having any numbness, weakness, but relates that she does have decreased motion due to the pain and discomfort.  She requests x-rays to make sure there is no fracture present.   Ankle Pain   Past Medical History:  Diagnosis Date   Anemia     Patient Active Problem List   Diagnosis Date Noted   Bite, insect 10/15/2021   Fibroids, submucosal 02/05/2014   Symptomatic anemia 12/22/2013   Vaginal bleeding 12/22/2013   Hypotension 12/22/2013   UTI (urinary tract infection) 12/22/2013    Past Surgical History:  Procedure Laterality Date   TUBAL LIGATION      OB History     Gravida  6   Para  5   Term  5   Preterm  0   AB  1   Living  5      SAB  1   IAB  0   Ectopic  0   Multiple  0   Live Births               Home Medications    Prior to Admission medications   Medication Sig Start Date End Date Taking? Authorizing Provider  ibuprofen (ADVIL) 800 MG tablet Take 1 tablet (800 mg total) by mouth 3 (three) times daily. 11/14/21   Nyoka Lint, PA-C  methocarbamol (ROBAXIN) 500 MG tablet Take 1 tablet (500 mg total) by mouth 2 (two) times daily. 09/21/21   Rondel Oh, MD    Family History Family History  Problem Relation Age of Onset   Cancer Mother     Cancer Father    Diabetes Father     Social History Social History   Tobacco Use   Smoking status: Never   Smokeless tobacco: Never  Vaping Use   Vaping Use: Never used  Substance Use Topics   Alcohol use: Yes    Comment: occassional   Drug use: No     Allergies   Patient has no known allergies.   Review of Systems Review of Systems  Musculoskeletal:  Positive for joint swelling (right ankle swelling and pain).     Physical Exam Triage Vital Signs ED Triage Vitals [11/14/21 1742]  Enc Vitals Group     BP (!) 167/97     Pulse Rate 70     Resp      Temp 98 F (36.7 C)     Temp Source Oral     SpO2 96 %     Weight      Height      Head Circumference      Peak Flow      Pain Score 10  Pain Loc      Pain Edu?      Excl. in [place]?    No data found.  Updated Vital Signs BP (!) 167/97 (BP Location: Left Arm)   Pulse 70   Temp 98 F (36.7 C) (Oral)   LMP 01/06/2015   SpO2 96%   Visual Acuity Right Eye Distance:   Left Eye Distance:   Bilateral Distance:    Right Eye Near:   Left Eye Near:    Bilateral Near:     Physical Exam Constitutional:      Appearance: Normal appearance.  Musculoskeletal:       Legs:     Comments: Right ankle: Plus swelling present lateral malleolus with pain on palpation.  Range of motion is limited with pain on flexion extension of the foot and rotation.  Stability is intact, no crepitus with range of motion.  Varus valgus stressing is normal  Neurological:     Mental Status: She is alert.      UC Treatments / Results  Labs (all labs ordered are listed, but only abnormal results are displayed) Labs Reviewed - No data to display  EKG   Radiology No results found.  Procedures Procedures (including critical care time)  Medications Ordered in UC Medications - No data to display  Initial Impression / Assessment and Plan / UC Course  I have reviewed the triage vital signs and the nursing notes.  Pertinent  labs & imaging results that were available during my care of the patient were reviewed by me and considered in my medical decision making (see chart for details).    Plan: 1.  The contusion of the right ankle will be treated with the following: A.  Ice therapy, 10 minutes on 20 minutes off, 3-4 times of the evening to help reduce pain and swelling. B.  Ankle support to help with stability and decrease pain. C.  Ibuprofen 800 mg every 8 hours to help reduce pain and swelling. 2.  Acute right ankle pain will be treated with the following: A.  Ibuprofen 800 mg every 8 hours with food to help reduce pain and swelling. 3.  Patient advised to follow-up PCP or return to urgent care if symptoms fail to improve.  Final Clinical Impressions(s) / UC Diagnoses   Final diagnoses:  Contusion of right ankle, initial encounter  Acute right ankle pain     Discharge Instructions      Advised to use ice therapy, 10 minutes on 20 minutes off, 3-4 times throughout the day to help reduce pain and swelling. Advised take ibuprofen 800 mg every 8 hours with food to help reduce pain and swelling. Advised to wear the ankle support when standing up and bearing weight on the ankle as this will help reduce pain and give better stability. Follow-up PCP or return to urgent care if symptoms fail to improve.     ED Prescriptions     Medication Sig Dispense Auth. Provider   ibuprofen (ADVIL) 800 MG tablet Take 1 tablet (800 mg total) by mouth 3 (three) times daily. 21 tablet Nyoka Lint, PA-C      PDMP not reviewed this encounter.   Nyoka Lint, PA-C 11/14/21 Einar Crow

## 2021-12-17 ENCOUNTER — Ambulatory Visit (HOSPITAL_COMMUNITY)
Admission: EM | Admit: 2021-12-17 | Discharge: 2021-12-17 | Disposition: A | Discharge status: Home / Self Care | Payer: 59 | Attending: Physician Assistant | Admitting: Physician Assistant

## 2021-12-17 ENCOUNTER — Encounter (HOSPITAL_COMMUNITY): Payer: Self-pay | Admitting: *Deleted

## 2021-12-17 DIAGNOSIS — K047 Periapical abscess without sinus: Secondary | ICD-10-CM

## 2021-12-17 DIAGNOSIS — R519 Headache, unspecified: Secondary | ICD-10-CM | POA: Diagnosis not present

## 2021-12-17 MED ORDER — AMOXICILLIN-POT CLAVULANATE 875-125 MG PO TABS: 1.0000 | ORAL_TABLET | Freq: Two times a day (BID) | ORAL | 0 refills | Status: DC

## 2021-12-17 NOTE — ED Triage Notes (Signed)
Pt states that she has had a headache x 2 weeks, tylenol and IBU help sometime but not a lot. She describes it as sharp shooting pain in her temples.   Pt states she has a dental abscess on the left bottom side. She doesn't have a dentist and wonders if it could be causing her headache.

## 2021-12-17 NOTE — ED Provider Notes (Signed)
[place]    CSN: [id number] Arrival date & time: 12/17/21  1611      History   Chief Complaint Chief Complaint  Patient presents with   Headache   Dental Pain    HPI Lindsey Simpson is a 56 y.o. female.   Patient here today for evaluation she has had the last two weeks. She states her headache is frontal and temporal. She does have some concerns for dental abscess to her left lower molar area- she is missing teeth in this area but report gum swelling and pain. She has not had fever. She denies any nausea or vomiting. She has not had vision changes. She denies any dizziness. She has tried OTC meds without resolution.  The history is provided by the patient.  Headache Associated symptoms: no fever, no nausea and no vomiting   Dental Pain Associated symptoms: headaches   Associated symptoms: no fever     Past Medical History:  Diagnosis Date   Anemia     Patient Active Problem List   Diagnosis Date Noted   Bite, insect 10/15/2021   Fibroids, submucosal 02/05/2014   Symptomatic anemia 12/22/2013   Vaginal bleeding 12/22/2013   Hypotension 12/22/2013   UTI (urinary tract infection) 12/22/2013    Past Surgical History:  Procedure Laterality Date   TUBAL LIGATION      OB History     Gravida  6   Para  5   Term  5   Preterm  0   AB  1   Living  5      SAB  1   IAB  0   Ectopic  0   Multiple  0   Live Births               Home Medications    Prior to Admission medications   Medication Sig Start Date End Date Taking? Authorizing Provider  amoxicillin-clavulanate (AUGMENTIN) 875-125 MG tablet Take 1 tablet by mouth every 12 (twelve) hours. 12/17/21  Yes Francene Finders, PA-C  ibuprofen (ADVIL) 800 MG tablet Take 1 tablet (800 mg total) by mouth 3 (three) times daily. 11/14/21   Nyoka Lint, PA-C  methocarbamol (ROBAXIN) 500 MG tablet Take 1 tablet (500 mg total) by mouth 2 (two) times daily. 09/21/21   Rondel Oh, MD     Family History Family History  Problem Relation Age of Onset   Cancer Mother    Cancer Father    Diabetes Father     Social History Social History   Tobacco Use   Smoking status: Never   Smokeless tobacco: Never  Vaping Use   Vaping Use: Never used  Substance Use Topics   Alcohol use: Yes    Comment: occassional   Drug use: No     Allergies   Patient has no known allergies.   Review of Systems Review of Systems  Constitutional:  Negative for chills and fever.  HENT:  Positive for dental problem. Negative for rhinorrhea.   Eyes:  Negative for discharge and redness.  Respiratory:  Negative for shortness of breath.   Gastrointestinal:  Negative for nausea and vomiting.  Neurological:  Positive for headaches.     Physical Exam Triage Vital Signs ED Triage Vitals  Enc Vitals Group     BP 12/17/21 1759 128/68     Pulse Rate 12/17/21 1759 67     Resp 12/17/21 1759 18     Temp 12/17/21 1759 98.1 F (36.7 C)  Temp Source 12/17/21 1759 Oral     SpO2 12/17/21 1759 99 %     Weight --      Height --      Head Circumference --      Peak Flow --      Pain Score 12/17/21 1758 8     Pain Loc --      Pain Edu? --      Excl. in [place]? --    No data found.  Updated Vital Signs BP 128/68 (BP Location: Right Arm)   Pulse 67   Temp 98.1 F (36.7 C) (Oral)   Resp 18   LMP 01/06/2015   SpO2 99%      Physical Exam Vitals and nursing note reviewed.  Constitutional:      General: She is not in acute distress.    Appearance: Normal appearance. She is not ill-appearing.  HENT:     Head: Normocephalic and atraumatic.  Eyes:     Conjunctiva/sclera: Conjunctivae normal.  Cardiovascular:     Rate and Rhythm: Normal rate.  Pulmonary:     Effort: Pulmonary effort is normal.  Neurological:     Mental Status: She is alert.  Psychiatric:        Mood and Affect: Mood normal.        Behavior: Behavior normal.        Thought Content: Thought content normal.       UC Treatments / Results  Labs (all labs ordered are listed, but only abnormal results are displayed) Labs Reviewed - No data to display  EKG   Radiology No results found.  Procedures Procedures (including critical care time)  Medications Ordered in UC Medications - No data to display  Initial Impression / Assessment and Plan / UC Course  I have reviewed the triage vital signs and the nursing notes.  Pertinent labs & imaging results that were available during my care of the patient were reviewed by me and considered in my medical decision making (see chart for details).    Will treat to cover dental abscess with augmentin which would also cover sinusitis given patient reports she had "cold" before headache started. Encouraged follow up if symptoms do not improve or worsen. Patient is in the process of finding dentist with current insurance.   Final Clinical Impressions(s) / UC Diagnoses   Final diagnoses:  Dental abscess  Acute nonintractable headache, unspecified headache type   Discharge Instructions   None    ED Prescriptions     Medication Sig Dispense Auth. Provider   amoxicillin-clavulanate (AUGMENTIN) 875-125 MG tablet Take 1 tablet by mouth every 12 (twelve) hours. 14 tablet Francene Finders, PA-C      PDMP not reviewed this encounter.   Francene Finders, PA-C 12/17/21 1922

## 2022-02-23 ENCOUNTER — Encounter (HOSPITAL_COMMUNITY): Payer: Self-pay

## 2022-02-23 ENCOUNTER — Ambulatory Visit (HOSPITAL_COMMUNITY)
Admission: EM | Admit: 2022-02-23 | Discharge: 2022-02-23 | Disposition: A | Discharge status: Home / Self Care | Payer: 59 | Attending: Family Medicine | Admitting: Family Medicine

## 2022-02-23 DIAGNOSIS — R519 Headache, unspecified: Secondary | ICD-10-CM

## 2022-02-23 DIAGNOSIS — H538 Other visual disturbances: Secondary | ICD-10-CM

## 2022-02-23 LAB — CBG MONITORING, ED: Glucose-Capillary: 114 mg/dL — ABNORMAL HIGH (ref 70–99)

## 2022-02-23 MED ORDER — METHOCARBAMOL 500 MG PO TABS: 500.0000 mg | ORAL_TABLET | Freq: Two times a day (BID) | ORAL | 0 refills | Status: DC

## 2022-02-23 NOTE — ED Triage Notes (Signed)
Pt is here for headaches on and off x over week, pt is in need to fill r/x for glasses but, she has not done it yet.

## 2022-02-26 NOTE — ED Provider Notes (Signed)
Lindsey Simpson   GS:[id number] 02/23/22 Arrival Time: [id number]  ASSESSMENT & PLAN:  1. Acute nonintractable headache, unspecified headache type   2. Blurry vision    Suspect tension HA. Trial of: Meds ordered this encounter  Medications   methocarbamol (ROBAXIN) 500 MG tablet    Sig: Take 1 tablet (500 mg total) by mouth 2 (two) times daily.    Dispense:  20 tablet    Refill:  0   Normal neurological exam. Afebrile without nuchal rigidity. Discussed. Current presentation and symptoms are consistent with prior headaches and are not consistent with SAH, ICH, meningitis, or temporal arteritis. No indication for urgent neurodiagnostic workup at this time. Discussed.  Currently without blurry vision symptoms. Elevated blood sugar; non-fasting. Recommend she return for a fasting blood sugar at her convenience.  Recommend:  Follow-up Information     [facility].   Specialty: Urgent Care Why: Return to recheck a fasting blood sugar. Contact information: Lindsey [phone]                 Reviewed expectations re: course of current medical issues. Questions answered. Outlined signs and symptoms indicating need for more acute intervention. Patient verbalized understanding. After Visit Summary given.   SUBJECTIVE: Patient is able to give a clear and coherent history.  Lindsey Simpson is a 57 y.o. female who presents with complaint of a headache more in neck spreading into LEFT head; gradual onset; worse with certain movements. No precipitating factors. Associated symptoms: Preceding aura: no. Nausea/vomiting: no. Increased sensitivity to light and to noises: no. Fever: no. Sinus pressure/congestion: no. Extremity weakness: no. No tx PTA. Current headache has not limited normal daily activities. Denies dizziness, loss of balance, muscle weakness, numbness of extremities, and speech  difficulties. No head injury reported. Ambulatory without difficulty. No recent travel.   Also mentions occas blurry vision. No thirst or freq urination. No h/o DM.   OBJECTIVE:  Vitals:   02/23/22 1934  BP: 132/81  Pulse: 71  Resp: 16  Temp: 98.3 F (36.8 C)  TempSrc: Oral  SpO2: 95%    General appearance: alert; NAD HENT: normocephalic; atraumatic Eyes: PERRLA; EOMI; conjunctivae normal Neck: supple with FROM Lungs: clear to auscultation bilaterally; unlabored respirations Heart: regular rate and rhythm Extremities: no edema; symmetrical with no gross deformities Skin: warm and dry Neurologic: alert; speech is fluent and clear without dysarthria or aphasia; CN 2-12 grossly intact; no facial droop; normal gait; normal symmetric reflexes; normal extremity strength and sensation throughout; bilateral upper and lower extremity sensation is grossly intact with 5/5 symmetric strength; normal grip strength; normal plantar and dorsi flexion bilaterally; normal finger to nose bilaterally; negative pronator drift; negative Romberg sign Psychological: alert and cooperative; normal mood and affect  Labs: Results for orders placed or performed during the hospital encounter of 02/23/22  POC CBG monitoring  Result Value Ref Range   Glucose-Capillary 114 (H) 70 - 99 mg/dL   Labs Reviewed  CBG MONITORING, ED - Abnormal; Notable for the following components:      Result Value   Glucose-Capillary 114 (*)    All other components within normal limits   No Known Allergies  Past Medical History:  Diagnosis Date   Anemia    Social History   Socioeconomic History   Marital status: Single    Spouse name: Not on file   Number of children: Not on file   Years of education: Not  on file   Highest education level: Not on file  Occupational History   Not on file  Tobacco Use   Smoking status: Never   Smokeless tobacco: Never  Vaping Use   Vaping Use: Never used  Substance and  Sexual Activity   Alcohol use: Yes    Comment: occassional   Drug use: No   Sexual activity: Yes    Birth control/protection: None  Other Topics Concern   Not on file  Social History Narrative   Not on file   Social Determinants of Health   Financial Resource Strain: Not on file  Food Insecurity: Not on file  Transportation Needs: Not on file  Physical Activity: Not on file  Stress: Not on file  Social Connections: Not on file  Intimate Partner Violence: Not on file   Family History  Problem Relation Age of Onset   Cancer Mother    Cancer Father    Diabetes Father    Past Surgical History:  Procedure Laterality Date   TUBAL LIGATION        Vanessa Kick, MD 02/26/22 1104

## 2022-04-20 ENCOUNTER — Ambulatory Visit (HOSPITAL_COMMUNITY)
Admission: EM | Admit: 2022-04-20 | Discharge: 2022-04-20 | Disposition: A | Discharge status: Home / Self Care | Payer: 59 | Attending: Emergency Medicine | Admitting: Emergency Medicine

## 2022-04-20 ENCOUNTER — Encounter (HOSPITAL_COMMUNITY): Payer: Self-pay

## 2022-04-20 DIAGNOSIS — R42 Dizziness and giddiness: Secondary | ICD-10-CM | POA: Insufficient documentation

## 2022-04-20 LAB — CBC
HCT: 43 % (ref 36.0–46.0)
Hemoglobin: 13.9 g/dL (ref 12.0–15.0)
MCH: 30.2 pg (ref 26.0–34.0)
MCHC: 32.3 g/dL (ref 30.0–36.0)
MCV: 93.5 fL (ref 80.0–100.0)
Platelets: 300 10*3/uL (ref 150–400)
RBC: 4.6 MIL/uL (ref 3.87–5.11)
RDW: 13.3 % (ref 11.5–15.5)
WBC: 6.2 10*3/uL (ref 4.0–10.5)
nRBC: 0 % (ref 0.0–0.2)

## 2022-04-20 LAB — POCT URINALYSIS DIPSTICK, ED / UC
Glucose, UA: NEGATIVE mg/dL
Ketones, ur: NEGATIVE mg/dL
Leukocytes,Ua: NEGATIVE
Nitrite: NEGATIVE
Protein, ur: NEGATIVE mg/dL
Specific Gravity, Urine: 1.025 (ref 1.005–1.030)
Urobilinogen, UA: 0.2 mg/dL (ref 0.0–1.0)
pH: 5.5 (ref 5.0–8.0)

## 2022-04-20 LAB — BASIC METABOLIC PANEL
Anion gap: 10 (ref 5–15)
BUN: 15 mg/dL (ref 6–20)
CO2: 28 mmol/L (ref 22–32)
Calcium: 9.7 mg/dL (ref 8.9–10.3)
Chloride: 100 mmol/L (ref 98–111)
Creatinine, Ser: 0.74 mg/dL (ref 0.44–1.00)
GFR, Estimated: >60 mL/min (ref >60)
Glucose, Bld: 78 mg/dL (ref 70–99)
Potassium: 4.4 mmol/L (ref 3.5–5.1)
Sodium: 138 mmol/L (ref 135–145)

## 2022-04-20 LAB — CBG MONITORING, ED: Glucose-Capillary: 71 mg/dL (ref 70–99)

## 2022-04-20 NOTE — Discharge Instructions (Addendum)
Your blood sugar was normal in clinic today.  Your urine was without signs or symptoms of infection.  It did show signs of slight dehydration with a little bit of bilirubin, please ensure you are drinking at least 64 ounces of water and avoiding alcohol.  Please continue to get an eye exam as I think this would benefit you.  Please schedule a follow-up appointment with our [facility] to establish with a primary care provider.  I will call you if there are any emergent lab results to review.  Please seek immediate care if you develop syncope, worst headache of your life, acute vision loss, or any worsening of symptoms.

## 2022-04-20 NOTE — ED Provider Notes (Signed)
[place]    CSN: [id number] Arrival date & time: 04/20/22  1513      History   Chief Complaint Chief Complaint  Patient presents with   Dizziness    HPI Lindsey Simpson is a 57 y.o. female.   Reports intermittent light headedness for a few weeks. Reports sometimes it is tolerable and sometimes it is worse, denies any triggering factors like position changes. She would like her blood sugar checked today in clinic and she purposefully has not had anything to eat for this test, but she has had some juice. Denies polyuria or polydipsia. Reports she has normal water intake.   Reports a family hx of diabetes. She does not have a PCP provider but would like one.   Feels like she has been having near-syncopal events. Denies loss of consciousness, N/V/D, recent illness, denies SOB, CP, or fevers.   The history is provided by the patient.  Dizziness Associated symptoms: no chest pain, no headaches, no palpitations, no shortness of breath, no vomiting and no weakness     Past Medical History:  Diagnosis Date   Anemia     Patient Active Problem List   Diagnosis Date Noted   Bite, insect 10/15/2021   Fibroids, submucosal 02/05/2014   Symptomatic anemia 12/22/2013   Vaginal bleeding 12/22/2013   Hypotension 12/22/2013   UTI (urinary tract infection) 12/22/2013    Past Surgical History:  Procedure Laterality Date   TUBAL LIGATION      OB History     Gravida  6   Para  5   Term  5   Preterm  0   AB  1   Living  5      SAB  1   IAB  0   Ectopic  0   Multiple  0   Live Births               Home Medications    Prior to Admission medications   Medication Sig Start Date End Date Taking? Authorizing Provider  methocarbamol (ROBAXIN) 500 MG tablet Take 1 tablet (500 mg total) by mouth 2 (two) times daily. 02/23/22   Vanessa Kick, MD    Family History Family History  Problem Relation Age of Onset   Cancer Mother    Cancer Father     Diabetes Father     Social History Social History   Tobacco Use   Smoking status: Never   Smokeless tobacco: Never  Vaping Use   Vaping Use: Never used  Substance Use Topics   Alcohol use: Yes    Comment: occassional   Drug use: No     Allergies   Patient has no known allergies.   Review of Systems Review of Systems  Constitutional:  Negative for chills and fever.  HENT:  Negative for ear pain and sore throat.   Eyes:  Negative for pain and visual disturbance.  Respiratory:  Negative for cough and shortness of breath.   Cardiovascular:  Negative for chest pain and palpitations.  Gastrointestinal:  Negative for abdominal pain and vomiting.  Genitourinary:  Negative for dysuria and hematuria.  Musculoskeletal:  Negative for arthralgias and back pain.  Skin:  Negative for color change and rash.  Neurological:  Positive for dizziness and light-headedness. Negative for seizures, syncope, weakness and headaches.  All other systems reviewed and are negative.    Physical Exam Triage Vital Signs ED Triage Vitals  Enc Vitals Group     BP  04/20/22 1645 (!) 145/101     Pulse Rate 04/20/22 1645 62     Resp 04/20/22 1645 18     Temp 04/20/22 1645 98 F (36.7 C)     Temp Source 04/20/22 1645 Oral     SpO2 04/20/22 1645 99 %     Weight --      Height --      Head Circumference --      Peak Flow --      Pain Score 04/20/22 1644 8     Pain Loc --      Pain Edu? --      Excl. in [place]? --    No data found.  Updated Vital Signs BP (!) 145/101 (BP Location: Left Wrist)   Pulse 62   Temp 98 F (36.7 C) (Oral)   Resp 18   LMP 01/06/2015   SpO2 99%   Visual Acuity Right Eye Distance:   Left Eye Distance:   Bilateral Distance:    Right Eye Near:   Left Eye Near:    Bilateral Near:     Physical Exam Vitals and nursing note reviewed.  Constitutional:      General: She is not in acute distress.    Appearance: Normal appearance. She is well-developed.  HENT:      Head: Normocephalic and atraumatic.     Right Ear: External ear normal.     Left Ear: External ear normal.     Nose: Nose normal.     Mouth/Throat:     Mouth: Mucous membranes are moist.  Eyes:     Conjunctiva/sclera: Conjunctivae normal.  Cardiovascular:     Rate and Rhythm: Normal rate and regular rhythm.     Heart sounds: Normal heart sounds. No murmur heard. Pulmonary:     Effort: Pulmonary effort is normal. No respiratory distress.     Breath sounds: Normal breath sounds.  Abdominal:     Palpations: Abdomen is soft.     Tenderness: There is no abdominal tenderness.  Musculoskeletal:        General: No swelling. Normal range of motion.     Cervical back: Normal range of motion and neck supple.  Lymphadenopathy:     Cervical: No cervical adenopathy.  Skin:    General: Skin is warm and dry.     Capillary Refill: Capillary refill takes less than 2 seconds.  Neurological:     Mental Status: She is alert and oriented to person, place, and time.  Psychiatric:        Mood and Affect: Mood normal.        Behavior: Behavior normal.      UC Treatments / Results  Labs (all labs ordered are listed, but only abnormal results are displayed) Labs Reviewed  POCT URINALYSIS DIPSTICK, ED / UC - Abnormal; Notable for the following components:      Result Value   Bilirubin Urine SMALL (*)    Hgb urine dipstick SMALL (*)    All other components within normal limits  CBC  BASIC METABOLIC PANEL  HEMOGLOBIN A1C  CBG MONITORING, ED    EKG   Radiology No results found.  Procedures Procedures (including critical care time)  Medications Ordered in UC Medications - No data to display  Initial Impression / Assessment and Plan / UC Course  I have reviewed the triage vital signs and the nursing notes.  Pertinent labs & imaging results that were available during my care of the patient were reviewed  by me and considered in my medical decision making (see chart for  details).  Vitals in triage reviewed, patient is hemodynamically stable. Intermittent dizziness and lightheadedness ongoing for the past few weeks. None currently. Denies HA or blurred vision. Low concern for hypertensive emergency. Requesting CBG check for diabetes, CBG in clinic 71.  Urinalysis positive for small bilirubin, patient reports drinking alcohol last night.  Also positive for small hemoglobin, negative for ketones, glucose, protein, nitrites or leukocytes. Low concern for diabetes or kidney damage at this point.  Obtained basic labs and A1c, will call if results indicate emergent follow-up.  Advised to establish with a primary care provider for further evaluation and workup.  Red flag and emergent return precautions discussed, patient verbalized understanding.  No questions at this point.    Final Clinical Impressions(s) / UC Diagnoses   Final diagnoses:  Dizziness     Discharge Instructions      Your blood sugar was normal in clinic today.  Your urine was without signs or symptoms of infection.  It did show signs of slight dehydration with a little bit of bilirubin, please ensure you are drinking at least 64 ounces of water and avoiding alcohol.  Please continue to get an eye exam as I think this would benefit you.  Please schedule a follow-up appointment with our [place] [facility] to establish with a primary care provider.  I will call you if there are any emergent lab results to review.  Please seek immediate care if you develop syncope, worst headache of your life, acute vision loss, or any worsening of symptoms.      ED Prescriptions   None    PDMP not reviewed this encounter.   Giulio Bertino, [place], [place] 04/20/22 1816

## 2022-04-20 NOTE — ED Triage Notes (Signed)
Patient feeling lightheaded off and on for a few weeks. Getting worse in the last week, no visual changes.

## 2022-04-21 LAB — HEMOGLOBIN A1C
Hgb A1c MFr Bld: 5.6 % (ref 4.8–5.6)
Mean Plasma Glucose: 114 mg/dL

## 2022-05-18 ENCOUNTER — Ambulatory Visit: Payer: Self-pay | Admitting: Nurse Practitioner

## 2022-05-27 ENCOUNTER — Encounter (HOSPITAL_COMMUNITY): Payer: Self-pay

## 2022-05-27 ENCOUNTER — Ambulatory Visit (HOSPITAL_COMMUNITY)
Admission: EM | Admit: 2022-05-27 | Discharge: 2022-05-27 | Disposition: A | Discharge status: Home / Self Care | Payer: Self-pay | Attending: Family Medicine | Admitting: Family Medicine

## 2022-05-27 DIAGNOSIS — R1084 Generalized abdominal pain: Secondary | ICD-10-CM | POA: Insufficient documentation

## 2022-05-27 LAB — COMPREHENSIVE METABOLIC PANEL
ALT: 20 U/L (ref 0–44)
AST: 33 U/L (ref 15–41)
Albumin: 3.9 g/dL (ref 3.5–5.0)
Alkaline Phosphatase: 91 U/L (ref 38–126)
Anion gap: 8 (ref 5–15)
BUN: 20 mg/dL (ref 6–20)
CO2: 28 mmol/L (ref 22–32)
Calcium: 9.3 mg/dL (ref 8.9–10.3)
Chloride: 101 mmol/L (ref 98–111)
Creatinine, Ser: 0.74 mg/dL (ref 0.44–1.00)
GFR, Estimated: >60 mL/min (ref >60)
Glucose, Bld: 62 mg/dL — ABNORMAL LOW (ref 70–99)
Potassium: 4.2 mmol/L (ref 3.5–5.1)
Sodium: 137 mmol/L (ref 135–145)
Total Bilirubin: 1.1 mg/dL (ref 0.3–1.2)
Total Protein: 7.9 g/dL (ref 6.5–8.1)

## 2022-05-27 LAB — CBC
HCT: 43.9 % (ref 36.0–46.0)
Hemoglobin: 14.2 g/dL (ref 12.0–15.0)
MCH: 30.3 pg (ref 26.0–34.0)
MCHC: 32.3 g/dL (ref 30.0–36.0)
MCV: 93.8 fL (ref 80.0–100.0)
Platelets: 279 10*3/uL (ref 150–400)
RBC: 4.68 MIL/uL (ref 3.87–5.11)
RDW: 13.6 % (ref 11.5–15.5)
WBC: 5.6 10*3/uL (ref 4.0–10.5)
nRBC: 0 % (ref 0.0–0.2)

## 2022-05-27 MED ORDER — DICYCLOMINE HCL 20 MG PO TABS: 20.0000 mg | ORAL_TABLET | Freq: Four times a day (QID) | ORAL | 0 refills | Status: DC | PRN

## 2022-05-27 MED ORDER — OMEPRAZOLE 40 MG PO CPDR: 40.0000 mg | DELAYED_RELEASE_CAPSULE | Freq: Every day | ORAL | 1 refills | Status: DC

## 2022-05-27 NOTE — Discharge Instructions (Addendum)
Take omeprazole 40 mg--1 capsule daily for stomach acid.   Dicyclomine--take 1 every 6 hours as needed for intestinal cramps  We have drawn blood to check your blood counts and your liver and kidney function numbers.  Staff will call you if there is anything significantly abnormal  You can use the QR code/website at the back of the summary paperwork to schedule yourself a new patient appointment with primary care

## 2022-05-27 NOTE — ED Triage Notes (Signed)
Pt present severe abdomen pain, symptoms started a while ago. Pt denies N/V/D states it just stomach pain/

## 2022-05-27 NOTE — ED Provider Notes (Signed)
[facility]    CSN: [id number] Arrival date & time: 05/27/22  1452      History   Chief Complaint Chief Complaint  Patient presents with   Abdominal Pain    HPI Lindsey Simpson is a 57 y.o. female.    Abdominal Pain Here for abdominal pain has been going on for about a week.  It is not persistent and apparently does come and go.  It can hurt in her lower quadrants or in her epigastric area.  No vomiting or diarrhea.  No fever.  No dysuria.  Last menstrual cycle was 3 to 4 years ago at least.  She mentions that she drinks beer sometimes, the last time was last night.     Past Medical History:  Diagnosis Date   Anemia     Patient Active Problem List   Diagnosis Date Noted   Bite, insect 10/15/2021   Fibroids, submucosal 02/05/2014   Symptomatic anemia 12/22/2013   Vaginal bleeding 12/22/2013   Hypotension 12/22/2013   UTI (urinary tract infection) 12/22/2013    Past Surgical History:  Procedure Laterality Date   TUBAL LIGATION      OB History     Gravida  6   Para  5   Term  5   Preterm  0   AB  1   Living  5      SAB  1   IAB  0   Ectopic  0   Multiple  0   Live Births               Home Medications    Prior to Admission medications   Medication Sig Start Date End Date Taking? Authorizing Provider  dicyclomine (BENTYL) 20 MG tablet Take 1 tablet (20 mg total) by mouth 4 (four) times daily as needed (intestinal cramps). 05/27/22  Yes Zenia Resides, MD  omeprazole (PRILOSEC) 40 MG capsule Take 1 capsule (40 mg total) by mouth daily. 05/27/22  Yes Doshie Maggi, Janace Aris, MD  methocarbamol (ROBAXIN) 500 MG tablet Take 1 tablet (500 mg total) by mouth 2 (two) times daily. 02/23/22   Mardella Layman, MD    Family History Family History  Problem Relation Age of Onset   Cancer Mother    Cancer Father    Diabetes Father     Social History Social History   Tobacco Use   Smoking status: Never   Smokeless tobacco:  Never  Vaping Use   Vaping Use: Never used  Substance Use Topics   Alcohol use: Yes    Comment: occassional   Drug use: No     Allergies   Patient has no known allergies.   Review of Systems Review of Systems  Gastrointestinal:  Positive for abdominal pain.     Physical Exam Triage Vital Signs ED Triage Vitals  Enc Vitals Group     BP 05/27/22 1544 130/68     Pulse Rate 05/27/22 1544 63     Resp 05/27/22 1544 18     Temp 05/27/22 1544 97.8 F (36.6 C)     Temp Source 05/27/22 1544 Oral     SpO2 05/27/22 1544 98 %     Weight --      Height --      Head Circumference --      Peak Flow --      Pain Score 05/27/22 1545 6     Pain Loc --      Pain Edu? --  Excl. in [place]? --    No data found.  Updated Vital Signs BP 130/68 (BP Location: Left Arm)   Pulse 63   Temp 97.8 F (36.6 C) (Oral)   Resp 18   LMP 01/06/2015   SpO2 98%   Visual Acuity Right Eye Distance:   Left Eye Distance:   Bilateral Distance:    Right Eye Near:   Left Eye Near:    Bilateral Near:     Physical Exam Vitals reviewed.  Constitutional:      General: She is not in acute distress.    Appearance: She is not ill-appearing, toxic-appearing or diaphoretic.  HENT:     Nose: Nose normal.     Mouth/Throat:     Mouth: Mucous membranes are moist.  Eyes:     Extraocular Movements: Extraocular movements intact.     Conjunctiva/sclera: Conjunctivae normal.     Pupils: Pupils are equal, round, and reactive to light.  Cardiovascular:     Rate and Rhythm: Normal rate and regular rhythm.     Heart sounds: No murmur heard. Pulmonary:     Effort: Pulmonary effort is normal.     Breath sounds: Normal breath sounds.  Abdominal:     General: There is no distension.     Palpations: Abdomen is soft. There is no mass.     Tenderness: There is no guarding.     Comments: There was some tenderness in the epigastric area and possibly in the suprapubic area also.  Musculoskeletal:     Cervical  back: Neck supple.  Lymphadenopathy:     Cervical: No cervical adenopathy.  Skin:    Coloration: Skin is not jaundiced or pale.  Neurological:     General: No focal deficit present.     Mental Status: She is alert and oriented to person, place, and time.      UC Treatments / Results  Labs (all labs ordered are listed, but only abnormal results are displayed) Labs Reviewed  CBC  COMPREHENSIVE METABOLIC PANEL    EKG   Radiology No results found.  Procedures Procedures (including critical care time)  Medications Ordered in UC Medications - No data to display  Initial Impression / Assessment and Plan / UC Course  I have reviewed the triage vital signs and the nursing notes.  Pertinent labs & imaging results that were available during my care of the patient were reviewed by me and considered in my medical decision making (see chart for details).        Prilosec is sent in to see if that will help any since she was tender in her epigastrium.  Bentyl is sent in for cramps.  I discussed with her to go to the emergency room if she worsens, and that it would be best if she could establish with a primary care doctor to evaluate further.  CBC and CMP are drawn today and we will notify her of any significant abnormalities Final Clinical Impressions(s) / UC Diagnoses   Final diagnoses:  Generalized abdominal pain     Discharge Instructions      Take omeprazole 40 mg--1 capsule daily for stomach acid.   Dicyclomine--take 1 every 6 hours as needed for intestinal cramps  We have drawn blood to check your blood counts and your liver and kidney function numbers.  Staff will call you if there is anything significantly abnormal  You can use the QR code/website at the back of the summary paperwork to schedule yourself a new  patient appointment with primary care     ED Prescriptions     Medication Sig Dispense Auth. Provider   omeprazole (PRILOSEC) 40 MG capsule Take 1  capsule (40 mg total) by mouth daily. 30 capsule Zenia Resides, MD   dicyclomine (BENTYL) 20 MG tablet Take 1 tablet (20 mg total) by mouth 4 (four) times daily as needed (intestinal cramps). 20 tablet Janetta Vandoren, Janace Aris, MD      PDMP not reviewed this encounter.   Zenia Resides, MD 05/27/22 [phone]

## 2022-06-27 ENCOUNTER — Encounter (HOSPITAL_COMMUNITY): Payer: Self-pay

## 2022-06-27 ENCOUNTER — Ambulatory Visit (HOSPITAL_COMMUNITY)
Admission: EM | Admit: 2022-06-27 | Discharge: 2022-06-27 | Disposition: A | Discharge status: Home / Self Care | Payer: Commercial Managed Care - HMO | Attending: Family Medicine | Admitting: Family Medicine

## 2022-06-27 DIAGNOSIS — H60393 Other infective otitis externa, bilateral: Secondary | ICD-10-CM

## 2022-06-27 MED ORDER — OFLOXACIN 0.3 % OT SOLN: 5.0000 [drp] | Freq: Two times a day (BID) | OTIC | 0 refills | Status: AC

## 2022-06-27 NOTE — ED Triage Notes (Signed)
Pt presents with Left ear pain. Pt stated symptoms started a while ago.

## 2022-06-27 NOTE — ED Provider Notes (Signed)
[facility]    CSN: [id number] Arrival date & time: 06/27/22  1629      History   Chief Complaint Chief Complaint  Patient presents with   Ear Pain    HPI Lindsey Simpson is a 57 y.o. female.   HPI Here for left ear pain that is been intermittent in the last 3 days.  No fever or chills and no cough or congestion.  Does relate that she often gets some earwax in her ears and sometimes she feels like she gets water in her ear  No allergies to medications  Past Medical History:  Diagnosis Date   Anemia     Patient Active Problem List   Diagnosis Date Noted   Bite, insect 10/15/2021   Fibroids, submucosal 02/05/2014   Symptomatic anemia 12/22/2013   Vaginal bleeding 12/22/2013   Hypotension 12/22/2013   UTI (urinary tract infection) 12/22/2013    Past Surgical History:  Procedure Laterality Date   TUBAL LIGATION      OB History     Gravida  6   Para  5   Term  5   Preterm  0   AB  1   Living  5      SAB  1   IAB  0   Ectopic  0   Multiple  0   Live Births               Home Medications    Prior to Admission medications   Medication Sig Start Date End Date Taking? Authorizing Provider  ofloxacin (FLOXIN) 0.3 % OTIC solution Place 5 drops into both ears 2 (two) times daily for 7 days. 06/27/22 07/04/22 Yes Zamir Staples, Janace Aris, MD  dicyclomine (BENTYL) 20 MG tablet Take 1 tablet (20 mg total) by mouth 4 (four) times daily as needed (intestinal cramps). 05/27/22   Zenia Resides, MD  methocarbamol (ROBAXIN) 500 MG tablet Take 1 tablet (500 mg total) by mouth 2 (two) times daily. 02/23/22   Mardella Layman, MD  omeprazole (PRILOSEC) 40 MG capsule Take 1 capsule (40 mg total) by mouth daily. 05/27/22   Zenia Resides, MD    Family History Family History  Problem Relation Age of Onset   Cancer Mother    Cancer Father    Diabetes Father     Social History Social History   Tobacco Use   Smoking status: Never    Smokeless tobacco: Never  Vaping Use   Vaping Use: Never used  Substance Use Topics   Alcohol use: Yes    Comment: occassional   Drug use: No     Allergies   Patient has no known allergies.   Review of Systems Review of Systems   Physical Exam Triage Vital Signs ED Triage Vitals [06/27/22 1740]  Enc Vitals Group     BP 118/69     Pulse Rate 64     Resp 18     Temp 98.2 F (36.8 C)     Temp Source Oral     SpO2 97 %     Weight      Height      Head Circumference      Peak Flow      Pain Score      Pain Loc      Pain Edu?      Excl. in [place]?    No data found.  Updated Vital Signs BP 118/69 (BP Location: Left Arm)  Pulse 64   Temp 98.2 F (36.8 C) (Oral)   Resp 18   LMP 01/06/2015   SpO2 97%   Visual Acuity Right Eye Distance:   Left Eye Distance:   Bilateral Distance:    Right Eye Near:   Left Eye Near:    Bilateral Near:     Physical Exam Vitals reviewed.  Constitutional:      General: She is not in acute distress.    Appearance: She is not ill-appearing, toxic-appearing or diaphoretic.  HENT:     Ears:     Comments: Both ear canals seem tender.  On the right there is a little bit of nonobstructive cerumen and the tympanic membrane is gray and shiny.  There is possibly a little swelling of the right ear canal wall. There is some tenderness on traction of the left pinna, and that canal is also tender.  The canal walls of the left ear are swollen and there is some white discharge adherent to the canal walls.  I cannot see the TM on that side     Mouth/Throat:     Mouth: Mucous membranes are moist.  Eyes:     Extraocular Movements: Extraocular movements intact.     Conjunctiva/sclera: Conjunctivae normal.     Pupils: Pupils are equal, round, and reactive to light.  Cardiovascular:     Rate and Rhythm: Normal rate and regular rhythm.     Heart sounds: No murmur heard. Pulmonary:     Effort: Pulmonary effort is normal.     Breath sounds: Normal  breath sounds.  Musculoskeletal:     Cervical back: Neck supple.  Lymphadenopathy:     Cervical: No cervical adenopathy.  Skin:    Coloration: Skin is not pale.  Neurological:     Mental Status: She is alert and oriented to person, place, and time.      UC Treatments / Results  Labs (all labs ordered are listed, but only abnormal results are displayed) Labs Reviewed - No data to display  EKG   Radiology No results found.  Procedures Procedures (including critical care time)  Medications Ordered in UC Medications - No data to display  Initial Impression / Assessment and Plan / UC Course  I have reviewed the triage vital signs and the nursing notes.  Pertinent labs & imaging results that were available during my care of the patient were reviewed by me and considered in my medical decision making (see chart for details).        I am going to treat with Floxin for otitis externa.  We discussed with her after she is finished the treatment to try a little bit of rubbing alcohol in each ear daily to try to evaporate water and dilute the cerumen. Final Clinical Impressions(s) / UC Diagnoses   Final diagnoses:  Infective otitis externa of both ears     Discharge Instructions      -Floxin eardrops-5 drops in the affected ear 2 times daily for 7 days.       ED Prescriptions     Medication Sig Dispense Auth. Provider   ofloxacin (FLOXIN) 0.3 % OTIC solution Place 5 drops into both ears 2 (two) times daily for 7 days. 5 mL Zenia Resides, MD      PDMP not reviewed this encounter.   Zenia Resides, MD 06/27/22 [phone]

## 2022-06-27 NOTE — Discharge Instructions (Signed)
-  Floxin eardrops-5 drops in the affected ear 2 times daily for 7 days.  

## 2022-08-12 ENCOUNTER — Ambulatory Visit (HOSPITAL_COMMUNITY)
Admission: EM | Admit: 2022-08-12 | Discharge: 2022-08-12 | Disposition: A | Discharge status: Home / Self Care | Payer: Commercial Managed Care - HMO | Attending: Emergency Medicine | Admitting: Emergency Medicine

## 2022-08-12 ENCOUNTER — Encounter (HOSPITAL_COMMUNITY): Payer: Self-pay

## 2022-08-12 DIAGNOSIS — M7918 Myalgia, other site: Secondary | ICD-10-CM

## 2022-08-12 DIAGNOSIS — M549 Dorsalgia, unspecified: Secondary | ICD-10-CM

## 2022-08-12 MED ORDER — METHOCARBAMOL 500 MG PO TABS: 500.0000 mg | ORAL_TABLET | Freq: Two times a day (BID) | ORAL | 0 refills | Status: AC

## 2022-08-12 NOTE — ED Provider Notes (Signed)
[facility]    CSN: [id number] Arrival date & time: 08/12/22  1453      History   Chief Complaint Chief Complaint  Patient presents with   Back Pain    HPI Lindsey Simpson is a 57 y.o. female.   57 year old female, Lindsey Simpson, presents to urgent care for evaluation of back pain. Pt reports hx of arthritis, requesting work note. No meds taken, no known injury. Pt denies fever,dysuria, loss of bowel and bladder,loss of function, or saddle numbness.   The history is provided by the patient. No language interpreter was used.    Past Medical History:  Diagnosis Date   Anemia     Patient Active Problem List   Diagnosis Date Noted   Musculoskeletal pain 08/12/2022   Acute right-sided back pain 08/12/2022   Bite, insect 10/15/2021   Fibroids, submucosal 02/05/2014   Symptomatic anemia 12/22/2013   Vaginal bleeding 12/22/2013   Hypotension 12/22/2013   UTI (urinary tract infection) 12/22/2013    Past Surgical History:  Procedure Laterality Date   TUBAL LIGATION      OB History     Gravida  6   Para  5   Term  5   Preterm  0   AB  1   Living  5      SAB  1   IAB  0   Ectopic  0   Multiple  0   Live Births               Home Medications    Prior to Admission medications   Medication Sig Start Date End Date Taking? Authorizing Provider  dicyclomine (BENTYL) 20 MG tablet Take 1 tablet (20 mg total) by mouth 4 (four) times daily as needed (intestinal cramps). 05/27/22   Zenia Resides, MD  methocarbamol (ROBAXIN) 500 MG tablet Take 1 tablet (500 mg total) by mouth 2 (two) times daily for 5 days. 08/12/22 08/17/22  Malayiah Mcbrayer, Para March, NP  omeprazole (PRILOSEC) 40 MG capsule Take 1 capsule (40 mg total) by mouth daily. 05/27/22   Zenia Resides, MD    Family History Family History  Problem Relation Age of Onset   Cancer Mother    Cancer Father    Diabetes Father     Social History Social History   Tobacco Use    Smoking status: Never   Smokeless tobacco: Never  Vaping Use   Vaping status: Never Used  Substance Use Topics   Alcohol use: Yes    Comment: occassional   Drug use: No     Allergies   Patient has no known allergies.   Review of Systems Review of Systems  Constitutional:  Negative for fever.  Gastrointestinal:  Negative for abdominal pain, diarrhea, nausea and vomiting.  Genitourinary:  Negative for dysuria.  Musculoskeletal:  Positive for back pain and myalgias.  Skin: Negative.   All other systems reviewed and are negative.    Physical Exam Triage Vital Signs ED Triage Vitals  Encounter Vitals Group     BP      Systolic BP Percentile      Diastolic BP Percentile      Pulse      Resp      Temp      Temp src      SpO2      Weight      Height      Head Circumference      Peak Flow  Pain Score      Pain Loc      Pain Education      Exclude from Growth Chart    No data found.  Updated Vital Signs BP 106/67 (BP Location: Left Arm)   Pulse 67   Temp 97.7 F (36.5 C)   Resp 16   Ht 5\' 10"  (1.778 m)   Wt 140 lb (63.5 kg)   LMP 01/06/2015   SpO2 97%   BMI 20.09 kg/m   Visual Acuity Right Eye Distance:   Left Eye Distance:   Bilateral Distance:    Right Eye Near:   Left Eye Near:    Bilateral Near:     Physical Exam Vitals and nursing note reviewed.  Musculoskeletal:       Back:  Neurological:     General: No focal deficit present.     Mental Status: She is alert and oriented to person, place, and time.     GCS: GCS eye subscore is 4. GCS verbal subscore is 5. GCS motor subscore is 6.     Cranial Nerves: No cranial nerve deficit.     Sensory: No sensory deficit.  Psychiatric:        Attention and Perception: Attention normal.        Mood and Affect: Mood normal.        Speech: Speech normal.        Behavior: Behavior normal.      UC Treatments / Results  Labs (all labs ordered are listed, but only abnormal results are  displayed) Labs Reviewed - No data to display  EKG   Radiology No results found.  Procedures Procedures (including critical care time)  Medications Ordered in UC Medications - No data to display  Initial Impression / Assessment and Plan / UC Course  I have reviewed the triage vital signs and the nursing notes.  Pertinent labs & imaging results that were available during my care of the patient were reviewed by me and considered in my medical decision making (see chart for details).     Ddx: Musculoskeletal pain, back pain,spasm Final Clinical Impressions(s) / UC Diagnoses   Final diagnoses:  Musculoskeletal pain  Acute right-sided back pain, unspecified back location     Discharge Instructions      Take muscle relaxer as directed ,take home meds as directed. May use heat or ice to back for comfort. May use lidocaine patch or biofreeze for pain. Please follow up with PCP, may need to referral to physical therapy for further back pain management. GO immediately to ER for loss of bowel and bladder,loss of function, saddle numbness, etc.      ED Prescriptions     Medication Sig Dispense Auth. Provider   methocarbamol (ROBAXIN) 500 MG tablet Take 1 tablet (500 mg total) by mouth 2 (two) times daily for 5 days. 10 tablet Taquilla Downum, Para March, NP      PDMP not reviewed this encounter.   Clancy Gourd, NP 08/12/22 1718

## 2022-08-12 NOTE — ED Triage Notes (Signed)
Low right side back pain. Has history pf arthritis. Onset pf current pain this morning when waking. No known falls but does lift heavy at work. Patient works at [employer].   No urinary symptoms.

## 2022-08-12 NOTE — Discharge Instructions (Addendum)
Take muscle relaxer as directed ,take home meds as directed. May use heat or ice to back for comfort. May use lidocaine patch or biofreeze for pain. Please follow up with PCP, may need to referral to physical therapy for further back pain management. GO immediately to ER for loss of bowel and bladder,loss of function, saddle numbness, etc.

## 2022-09-12 ENCOUNTER — Ambulatory Visit (HOSPITAL_COMMUNITY)
Admission: EM | Admit: 2022-09-12 | Discharge: 2022-09-12 | Disposition: A | Discharge status: Home / Self Care | Payer: Self-pay | Attending: Internal Medicine | Admitting: Internal Medicine

## 2022-09-12 ENCOUNTER — Encounter (HOSPITAL_COMMUNITY): Payer: Self-pay

## 2022-09-12 DIAGNOSIS — H60392 Other infective otitis externa, left ear: Secondary | ICD-10-CM

## 2022-09-12 MED ORDER — OFLOXACIN 0.3 % OT SOLN: 5.0000 [drp] | Freq: Two times a day (BID) | OTIC | 0 refills | Status: DC

## 2022-09-12 NOTE — ED Triage Notes (Signed)
Pt presents with left ear pain for a few days off and on. Denies any other symptoms.

## 2022-09-12 NOTE — ED Provider Notes (Signed)
[facility]    CSN: [id number] Arrival date & time: 09/12/22  1729      History   Chief Complaint Chief Complaint  Patient presents with   Otalgia    HPI Lindsey Simpson is a 57 y.o. female.   57 year old female who is seen in the clinic for left ear pain.  She has on and off ear pain especially on the left side.  She reports that she gets a lot of earwax and uses Q-tips regularly to try and remove the wax.  She reports that the pain is only mild in nature.  She denies any fevers, chills, headaches, upper respiratory symptoms, congestion.  She has not had any drainage from the ear.  She denies any hearing loss.  She does not currently have a primary physician.    Otalgia Associated symptoms: no abdominal pain, no congestion, no cough, no fever, no hearing loss, no rash, no sore throat and no vomiting     Past Medical History:  Diagnosis Date   Anemia     Patient Active Problem List   Diagnosis Date Noted   Musculoskeletal pain 08/12/2022   Acute right-sided back pain 08/12/2022   Bite, insect 10/15/2021   Fibroids, submucosal 02/05/2014   Symptomatic anemia 12/22/2013   Vaginal bleeding 12/22/2013   Hypotension 12/22/2013   UTI (urinary tract infection) 12/22/2013    Past Surgical History:  Procedure Laterality Date   TUBAL LIGATION      OB History     Gravida  6   Para  5   Term  5   Preterm  0   AB  1   Living  5      SAB  1   IAB  0   Ectopic  0   Multiple  0   Live Births               Home Medications    Prior to Admission medications   Medication Sig Start Date End Date Taking? Authorizing Provider  dicyclomine (BENTYL) 20 MG tablet Take 1 tablet (20 mg total) by mouth 4 (four) times daily as needed (intestinal cramps). 05/27/22   Zenia Resides, MD  omeprazole (PRILOSEC) 40 MG capsule Take 1 capsule (40 mg total) by mouth daily. 05/27/22   Zenia Resides, MD    Family History Family History  Problem  Relation Age of Onset   Cancer Mother    Cancer Father    Diabetes Father     Social History Social History   Tobacco Use   Smoking status: Never   Smokeless tobacco: Never  Vaping Use   Vaping status: Never Used  Substance Use Topics   Alcohol use: Yes    Comment: occassional   Drug use: No     Allergies   Patient has no known allergies.   Review of Systems Review of Systems  Constitutional:  Negative for chills and fever.  HENT:  Positive for ear pain. Negative for congestion, hearing loss, nosebleeds, sinus pressure and sore throat.   Eyes:  Negative for pain and visual disturbance.  Respiratory:  Negative for cough and shortness of breath.   Cardiovascular:  Negative for chest pain and palpitations.  Gastrointestinal:  Negative for abdominal pain and vomiting.  Genitourinary:  Negative for dysuria and hematuria.  Musculoskeletal:  Negative for arthralgias and back pain.  Skin:  Negative for color change and rash.  Neurological:  Negative for seizures and syncope.  All other  systems reviewed and are negative.    Physical Exam Triage Vital Signs ED Triage Vitals  Encounter Vitals Group     BP 09/12/22 1921 124/78     Systolic BP Percentile --      Diastolic BP Percentile --      Pulse Rate 09/12/22 1921 60     Resp 09/12/22 1921 18     Temp 09/12/22 1921 97.8 F (36.6 C)     Temp src --      SpO2 09/12/22 1921 96 %     Weight --      Height --      Head Circumference --      Peak Flow --      Pain Score 09/12/22 1920 6     Pain Loc --      Pain Education --      Exclude from Growth Chart --    No data found.  Updated Vital Signs BP 124/78   Pulse 60   Temp 97.8 F (36.6 C)   Resp 18   LMP 01/06/2015   SpO2 96%   Visual Acuity Right Eye Distance:   Left Eye Distance:   Bilateral Distance:    Right Eye Near:   Left Eye Near:    Bilateral Near:     Physical Exam Vitals and nursing note reviewed.  Constitutional:      General: She is  not in acute distress.    Appearance: She is well-developed.  HENT:     Head: Normocephalic and atraumatic.     Right Ear: Tympanic membrane and ear canal normal.     Left Ear: Tympanic membrane normal. Drainage (In canal, canal erythematous) present.     Ears:      Nose: No congestion.  Eyes:     Conjunctiva/sclera: Conjunctivae normal.  Cardiovascular:     Rate and Rhythm: Normal rate and regular rhythm.     Heart sounds: No murmur heard. Pulmonary:     Effort: Pulmonary effort is normal. No respiratory distress.     Breath sounds: Normal breath sounds.  Abdominal:     Palpations: Abdomen is soft.     Tenderness: There is no abdominal tenderness.  Musculoskeletal:        General: No swelling.     Cervical back: Neck supple.  Skin:    General: Skin is warm and dry.     Capillary Refill: Capillary refill takes less than 2 seconds.  Neurological:     Mental Status: She is alert.  Psychiatric:        Mood and Affect: Mood normal.      UC Treatments / Results  Labs (all labs ordered are listed, but only abnormal results are displayed) Labs Reviewed - No data to display  EKG   Radiology No results found.  Procedures Procedures (including critical care time)  Medications Ordered in UC Medications - No data to display  Initial Impression / Assessment and Plan / UC Course  I have reviewed the triage vital signs and the nursing notes.  Pertinent labs & imaging results that were available during my care of the patient were reviewed by me and considered in my medical decision making (see chart for details).     Left otitis Externa: start floxin 0.5% 5 drops to the left ear twice daily for 7 days. May use alcohol or peroxide after to help with the ear wax build up but recommend avoiding using a q-tip in the ear regularly.  If she continues to have recurrent otitis externa, then recommend evaluation with ENT and gave her a number if she wishes to pursue this.  Return to  urgent care if symptoms worsen or fail to improve Final Clinical Impressions(s) / UC Diagnoses   Final diagnoses:  None   Discharge Instructions   None    ED Prescriptions   None    PDMP not reviewed this encounter.   Landis Martins, [place] 09/12/22 1942

## 2022-09-12 NOTE — Discharge Instructions (Addendum)
Start floxin 5 drops in the left ear twice daily for 7 days  If continuing to have ear pain and infections, then recommend evaluation with an ENT.  May return to urgent care if symptoms worsen or fail to improve.

## 2022-10-02 ENCOUNTER — Ambulatory Visit (HOSPITAL_COMMUNITY)
Admission: EM | Admit: 2022-10-02 | Discharge: 2022-10-02 | Disposition: A | Discharge status: Home / Self Care | Payer: Self-pay | Attending: Emergency Medicine | Admitting: Emergency Medicine

## 2022-10-02 ENCOUNTER — Encounter (HOSPITAL_COMMUNITY): Payer: Self-pay

## 2022-10-02 DIAGNOSIS — Z1152 Encounter for screening for COVID-19: Secondary | ICD-10-CM | POA: Insufficient documentation

## 2022-10-02 DIAGNOSIS — J069 Acute upper respiratory infection, unspecified: Secondary | ICD-10-CM | POA: Insufficient documentation

## 2022-10-02 HISTORY — DX: Gastro-esophageal reflux disease without esophagitis: K21.9

## 2022-10-02 MED ORDER — BENZONATATE 200 MG PO CAPS: 200.0000 mg | ORAL_CAPSULE | Freq: Three times a day (TID) | ORAL | 0 refills | Status: DC

## 2022-10-02 NOTE — Discharge Instructions (Addendum)
Your symptoms are consistent with a viral upper respiratory infection.  You can alternate between 800 mg of ibuprofen and 500 mg of Tylenol for any fever, body aches or pains.  You can sleep with a humidifier to help with any sore throat and congestion.  You can also take the Tessalon Perles every 8 hours for cough suppression.  Return to clinic for any new or urgent symptoms.

## 2022-10-02 NOTE — ED Provider Notes (Signed)
[facility]    CSN: [id number] Arrival date & time: 10/02/22  1701      History   Chief Complaint Chief Complaint  Patient presents with   URI    HPI Lindsey Simpson is a 57 y.o. female.   Patient presents to clinic for complaints of cough and chest congestion.  She has some nasal drainage.  Cough is dry.  She denies any shortness of breath, chest pain or wheezing.  No fevers.  Reports multiple sick contacts at work.  She did take ibuprofen with some relief.      The history is provided by the patient and medical records.  URI Presenting symptoms: congestion and cough   Presenting symptoms: no fatigue, no fever and no sore throat     Past Medical History:  Diagnosis Date   Anemia    GERD (gastroesophageal reflux disease)     Patient Active Problem List   Diagnosis Date Noted   Musculoskeletal pain 08/12/2022   Acute right-sided back pain 08/12/2022   Bite, insect 10/15/2021   Fibroids, submucosal 02/05/2014   Symptomatic anemia 12/22/2013   Vaginal bleeding 12/22/2013   Hypotension 12/22/2013   UTI (urinary tract infection) 12/22/2013    Past Surgical History:  Procedure Laterality Date   TUBAL LIGATION      OB History     Gravida  6   Para  5   Term  5   Preterm  0   AB  1   Living  5      SAB  1   IAB  0   Ectopic  0   Multiple  0   Live Births               Home Medications    Prior to Admission medications   Medication Sig Start Date End Date Taking? Authorizing Provider  benzonatate (TESSALON) 200 MG capsule Take 1 capsule (200 mg total) by mouth every 8 (eight) hours. 10/02/22  Yes Rinaldo Ratel, [place] N, FNP  omeprazole (PRILOSEC) 40 MG capsule Take 1 capsule (40 mg total) by mouth daily. 05/27/22   Zenia Resides, MD    Family History Family History  Problem Relation Age of Onset   Cancer Mother    Cancer Father    Diabetes Father     Social History Social History   Tobacco Use   Smoking  status: Never   Smokeless tobacco: Never  Vaping Use   Vaping status: Never Used  Substance Use Topics   Alcohol use: Yes    Comment: occassional   Drug use: No     Allergies   Patient has no known allergies.   Review of Systems Review of Systems  Constitutional:  Negative for fatigue and fever.  HENT:  Positive for congestion. Negative for sore throat.   Respiratory:  Positive for cough. Negative for shortness of breath.   Cardiovascular:  Negative for chest pain.     Physical Exam Triage Vital Signs ED Triage Vitals  Encounter Vitals Group     BP 10/02/22 1727 119/75     Systolic BP Percentile --      Diastolic BP Percentile --      Pulse Rate 10/02/22 1727 60     Resp 10/02/22 1727 16     Temp 10/02/22 1727 97.7 F (36.5 C)     Temp Source 10/02/22 1727 Oral     SpO2 10/02/22 1727 97 %     Weight 10/02/22 1727  136 lb (61.7 kg)     Height 10/02/22 1727 5\' 10"  (1.778 m)     Head Circumference --      Peak Flow --      Pain Score 10/02/22 1726 6     Pain Loc --      Pain Education --      Exclude from Growth Chart --    No data found.  Updated Vital Signs BP 119/75 (BP Location: Right Arm)   Pulse 60   Temp 97.7 F (36.5 C) (Oral)   Resp 16   Ht 5\' 10"  (1.778 m)   Wt 136 lb (61.7 kg)   LMP 01/06/2015   SpO2 97%   BMI 19.51 kg/m   Visual Acuity Right Eye Distance:   Left Eye Distance:   Bilateral Distance:    Right Eye Near:   Left Eye Near:    Bilateral Near:     Physical Exam Vitals and nursing note reviewed.  Constitutional:      Appearance: Normal appearance.  HENT:     Head: Normocephalic and atraumatic.     Right Ear: External ear normal.     Left Ear: External ear normal.     Nose: Nose normal.     Mouth/Throat:     Mouth: Mucous membranes are moist.  Eyes:     Conjunctiva/sclera: Conjunctivae normal.  Cardiovascular:     Rate and Rhythm: Normal rate and regular rhythm.     Heart sounds: Normal heart sounds. No murmur  heard. Pulmonary:     Effort: Pulmonary effort is normal. No respiratory distress.     Breath sounds: Normal breath sounds.  Musculoskeletal:        General: Normal range of motion.  Skin:    General: Skin is warm and dry.  Neurological:     General: No focal deficit present.     Mental Status: She is alert and oriented to person, place, and time.  Psychiatric:        Mood and Affect: Mood normal.        Behavior: Behavior normal.      UC Treatments / Results  Labs (all labs ordered are listed, but only abnormal results are displayed) Labs Reviewed  SARS CORONAVIRUS 2 (TAT 6-24 HRS)    EKG   Radiology No results found.  Procedures Procedures (including critical care time)  Medications Ordered in UC Medications - No data to display  Initial Impression / Assessment and Plan / UC Course  I have reviewed the triage vital signs and the nursing notes.  Pertinent labs & imaging results that were available during my care of the patient were reviewed by me and considered in my medical decision making (see chart for details).  Vitals and triage reviewed, patient is hemodynamically stable.  Lungs are vesicular, heart with regular rate and rhythm.  Symptoms consistent with viral URI, COVID-19 testing obtained.  Symptomatic management discussed.  Plan of care, follow-up care return precautions given, no questions at this time.     Final Clinical Impressions(s) / UC Diagnoses   Final diagnoses:  Viral URI with cough     Discharge Instructions      Your symptoms are consistent with a viral upper respiratory infection.  You can alternate between 800 mg of ibuprofen and 500 mg of Tylenol for any fever, body aches or pains.  You can sleep with a humidifier to help with any sore throat and congestion.  You can also take the [facility]  Perles every 8 hours for cough suppression.  Return to clinic for any new or urgent symptoms.     ED Prescriptions     Medication Sig  Dispense Auth. Provider   benzonatate (TESSALON) 200 MG capsule Take 1 capsule (200 mg total) by mouth every 8 (eight) hours. 30 capsule Lanson Randle, [place] N, [place]      PDMP not reviewed this encounter.   Jayshaun Phillips, [place] N, [place] 10/02/22 1759

## 2022-10-02 NOTE — ED Triage Notes (Signed)
Patient having cough, chest congestion and pain. Patient has history of heartburn as well. No known sick exposure. Onset of symptoms 3-4 days ago.  Patient has tried tylenol with slight relief.

## 2022-10-03 LAB — SARS CORONAVIRUS 2 (TAT 6-24 HRS): SARS Coronavirus 2: NEGATIVE

## 2022-11-16 ENCOUNTER — Encounter (HOSPITAL_COMMUNITY): Payer: Self-pay

## 2022-11-16 ENCOUNTER — Emergency Department (HOSPITAL_COMMUNITY)
Admission: EM | Admit: 2022-11-16 | Discharge: 2022-11-17 | Disposition: A | Discharge status: Home / Self Care | Payer: Self-pay | Attending: Emergency Medicine | Admitting: Emergency Medicine

## 2022-11-16 DIAGNOSIS — K219 Gastro-esophageal reflux disease without esophagitis: Secondary | ICD-10-CM | POA: Insufficient documentation

## 2022-11-16 DIAGNOSIS — R109 Unspecified abdominal pain: Secondary | ICD-10-CM | POA: Insufficient documentation

## 2022-11-16 LAB — COMPREHENSIVE METABOLIC PANEL
ALT: 18 U/L (ref 0–44)
AST: 21 U/L (ref 15–41)
Albumin: 3.4 g/dL — ABNORMAL LOW (ref 3.5–5.0)
Alkaline Phosphatase: 78 U/L (ref 38–126)
Anion gap: 10 (ref 5–15)
BUN: 15 mg/dL (ref 6–20)
CO2: 26 mmol/L (ref 22–32)
Calcium: 9.1 mg/dL (ref 8.9–10.3)
Chloride: 103 mmol/L (ref 98–111)
Creatinine, Ser: 0.78 mg/dL (ref 0.44–1.00)
GFR, Estimated: >60 mL/min (ref >60)
Glucose, Bld: 99 mg/dL (ref 70–99)
Potassium: 3.9 mmol/L (ref 3.5–5.1)
Sodium: 139 mmol/L (ref 135–145)
Total Bilirubin: 1.2 mg/dL (ref 0.3–1.2)
Total Protein: 7 g/dL (ref 6.5–8.1)

## 2022-11-16 LAB — CBC
HCT: 40 % (ref 36.0–46.0)
Hemoglobin: 12.9 g/dL (ref 12.0–15.0)
MCH: 30.4 pg (ref 26.0–34.0)
MCHC: 32.3 g/dL (ref 30.0–36.0)
MCV: 94.3 fL (ref 80.0–100.0)
Platelets: 229 10*3/uL (ref 150–400)
RBC: 4.24 MIL/uL (ref 3.87–5.11)
RDW: 13.4 % (ref 11.5–15.5)
WBC: 4.9 10*3/uL (ref 4.0–10.5)
nRBC: 0 % (ref 0.0–0.2)

## 2022-11-16 LAB — URINALYSIS, ROUTINE W REFLEX MICROSCOPIC
Bilirubin Urine: NEGATIVE
Glucose, UA: NEGATIVE mg/dL
Hgb urine dipstick: NEGATIVE
Ketones, ur: NEGATIVE mg/dL
Leukocytes,Ua: NEGATIVE
Nitrite: NEGATIVE
Protein, ur: NEGATIVE mg/dL
Specific Gravity, Urine: 1.018 (ref 1.005–1.030)
pH: 6 (ref 5.0–8.0)

## 2022-11-16 LAB — LIPASE, BLOOD: Lipase: 21 U/L (ref 11–51)

## 2022-11-16 NOTE — ED Triage Notes (Signed)
Pt is coming in for worsening abd pain that has been going on for a while now as she states it. She mentions it has not caused her any nausea or vomiting and there has no been any disruption in her bowel patterns. She endorses and mass she can feel in her abd that will move sometimes. Eating and drinking seems to have no correlation in the pain she feels in her abd. She is otherwise stable and has no other complaints at this time.

## 2022-11-17 ENCOUNTER — Emergency Department (HOSPITAL_COMMUNITY): Payer: Self-pay

## 2022-11-17 MED ORDER — IOHEXOL 350 MG/ML SOLN
75.0000 mL | Freq: Once | INTRAVENOUS | Status: AC | PRN
  Administered 2022-11-17: 75 mL via INTRAVENOUS

## 2022-11-17 NOTE — ED Notes (Signed)
Patient transported to CT 

## 2022-11-17 NOTE — Discharge Instructions (Signed)
You were evaluated in the Emergency Department and after careful evaluation, we did not find any emergent condition requiring admission or further testing in the hospital.  Your exam/testing today is overall reassuring.  Recommend follow-up with your primary care doctor to discuss your symptoms.  Please return to the Emergency Department if you experience any worsening of your condition.   Thank you for allowing [place] to be a part of your care.

## 2022-11-17 NOTE — ED Provider Notes (Signed)
[facility] Emergency Department Provider Note MRN:  [id number]  Arrival date & time: 11/17/22     Chief Complaint   Abdominal Pain   History of Present Illness   Lindsey Simpson is a 57 y.o. year-old female with a history of GERD presenting to the ED with chief complaint of abdominal pain.  Migratory abdominal pain for the past few weeks, feels like something is in her abdomen moving around causing discomfort.  Wakes from sleep sometimes.  Denies fever, no nausea vomiting or diarrhea.  Review of Systems  A thorough review of systems was obtained and all systems are negative except as noted in the HPI and PMH.   Patient's Health History    Past Medical History:  Diagnosis Date   Anemia    GERD (gastroesophageal reflux disease)     Past Surgical History:  Procedure Laterality Date   TUBAL LIGATION      Family History  Problem Relation Age of Onset   Cancer Mother    Cancer Father    Diabetes Father     Social History   Socioeconomic History   Marital status: Single    Spouse name: Not on file   Number of children: Not on file   Years of education: Not on file   Highest education level: Not on file  Occupational History   Not on file  Tobacco Use   Smoking status: Never   Smokeless tobacco: Never  Vaping Use   Vaping status: Never Used  Substance and Sexual Activity   Alcohol use: Yes    Comment: occassional   Drug use: No   Sexual activity: Yes    Birth control/protection: None  Other Topics Concern   Not on file  Social History Narrative   Not on file   Social Determinants of Health   Financial Resource Strain: Not on file  Food Insecurity: Not on file  Transportation Needs: Not on file  Physical Activity: Not on file  Stress: Not on file  Social Connections: Not on file  Intimate Partner Violence: Not on file     Physical Exam   Vitals:   11/17/22 0515 11/17/22 0531  BP: (!) 142/88   Pulse: (!) 54   Resp: 15    Temp:  98.4 F (36.9 C)  SpO2: 100%     CONSTITUTIONAL: Well-appearing, NAD NEURO/PSYCH:  Alert and oriented x 3, no focal deficits EYES:  eyes equal and reactive ENT/NECK:  no LAD, no JVD CARDIO: Regular rate, well-perfused, normal S1 and S2 PULM:  CTAB no wheezing or rhonchi GI/GU:  non-distended, non-tender MSK/SPINE:  No gross deformities, no edema SKIN:  no rash, atraumatic   *Additional and/or pertinent findings included in MDM below  Diagnostic and Interventional Summary    EKG Interpretation Date/Time:    Ventricular Rate:    PR Interval:    QRS Duration:    QT Interval:    QTC Calculation:   R Axis:      Text Interpretation:         Labs Reviewed  COMPREHENSIVE METABOLIC PANEL - Abnormal; Notable for the following components:      Result Value   Albumin 3.4 (*)    All other components within normal limits  URINALYSIS, ROUTINE W REFLEX MICROSCOPIC - Abnormal; Notable for the following components:   APPearance HAZY (*)    All other components within normal limits  LIPASE, BLOOD  CBC    CT ABDOMEN PELVIS W CONTRAST  Final  Result      Medications  iohexol (OMNIPAQUE) 350 MG/ML injection 75 mL (75 mLs Intravenous Contrast Given 11/17/22 0218)     Procedures  /  Critical Care Procedures  ED Course and Medical Decision Making  Initial Impression and Ddx Abdomen soft and nontender with no rebound guarding or rigidity, vital signs are normal, fairly long duration of symptoms.  Overall doubt emergent process.  Suspect symptoms related to peristalsis.  Shared decision making utilized, explained that CT imaging is low yield this evening, still patient wants further testing.  Awaiting CT.  Past medical/surgical history that increases complexity of ED encounter: None  Interpretation of Diagnostics I personally reviewed the laboratory assessment and my interpretation is as follows: No significant blood count or electrolyte disturbance  CT abdomen without  emergent process.  Patient Reassessment and Ultimate Disposition/Management     Discharge  Patient management required discussion with the following services or consulting groups:  None  Complexity of Problems Addressed Acute illness or injury that poses threat of life of bodily function  Additional Data Reviewed and Analyzed Further history obtained from: Further history from spouse/family member  Additional Factors Impacting ED Encounter Risk Consideration of hospitalization  Elmer Sow. Pilar Plate, MD [facility] Emergency Medicine [facility] [email or web address]  Final Clinical Impressions(s) / ED Diagnoses     ICD-10-CM   1. Abdominal pain, unspecified abdominal location  R10.9       ED Discharge Orders     None        Discharge Instructions Discussed with and Provided to Patient:     Discharge Instructions      You were evaluated in the Emergency Department and after careful evaluation, we did not find any emergent condition requiring admission or further testing in the hospital.  Your exam/testing today is overall reassuring.  Recommend follow-up with your primary care doctor to discuss your symptoms.  Please return to the Emergency Department if you experience any worsening of your condition.   Thank you for allowing [place] to be a part of your care.       Sabas Sous, MD 11/17/22 [phone]

## 2022-11-27 ENCOUNTER — Encounter (HOSPITAL_COMMUNITY): Payer: Self-pay

## 2022-11-27 ENCOUNTER — Ambulatory Visit (HOSPITAL_COMMUNITY)
Admission: EM | Admit: 2022-11-27 | Discharge: 2022-11-27 | Disposition: A | Discharge status: Home / Self Care | Payer: Self-pay | Attending: Emergency Medicine | Admitting: Emergency Medicine

## 2022-11-27 DIAGNOSIS — M545 Low back pain, unspecified: Secondary | ICD-10-CM

## 2022-11-27 MED ORDER — METHOCARBAMOL 500 MG PO TABS: 500.0000 mg | ORAL_TABLET | Freq: Two times a day (BID) | ORAL | 0 refills | Status: DC

## 2022-11-27 NOTE — ED Triage Notes (Signed)
Pt states that she has lower back pain x3-4 days

## 2022-11-27 NOTE — Discharge Instructions (Addendum)
Your back pain appears to be muscular.  Continue with your Epsom salt baths, warm compresses and gentle stretching.  You can use the muscle relaxer up to 2 times daily, do not drink or drive on this medication as it may cause drowsiness.  Ensure you are using proper lifting technique at work and taking your time with lifting.  Return to clinic for new or urgent symptoms.

## 2022-11-27 NOTE — ED Provider Notes (Signed)
[facility]    CSN: [id number] Arrival date & time: 11/27/22  1609      History   Chief Complaint Chief Complaint  Patient presents with   Back Pain    Back pain     HPI Lindsey Simpson is a 57 y.o. female.   Patient reports right-sided low back pain that started at work a few nights ago.  She does repetitive lifting at work and does not lift over the large or heavy boxes, but sometimes will stack them to lift multiple at a time.  She denies any falls or trauma.  No dysuria.  She has been doing warm Epsom salt baths at home that have been helping the pain, but it still remains.  No pain at rest, pain presents with bending or twisting her back.  No numbness or tingling.  No incontinence.  Has not taken any oral medications for her pain. Ambulatory.   The history is provided by the patient and medical records.  Back Pain   Past Medical History:  Diagnosis Date   Anemia    GERD (gastroesophageal reflux disease)     Patient Active Problem List   Diagnosis Date Noted   Musculoskeletal pain 08/12/2022   Acute right-sided back pain 08/12/2022   Bite, insect 10/15/2021   Fibroids, submucosal 02/05/2014   Symptomatic anemia 12/22/2013   Vaginal bleeding 12/22/2013   Hypotension 12/22/2013   UTI (urinary tract infection) 12/22/2013    Past Surgical History:  Procedure Laterality Date   TUBAL LIGATION      OB History     Gravida  6   Para  5   Term  5   Preterm  0   AB  1   Living  5      SAB  1   IAB  0   Ectopic  0   Multiple  0   Live Births               Home Medications    Prior to Admission medications   Medication Sig Start Date End Date Taking? Authorizing Provider  methocarbamol (ROBAXIN) 500 MG tablet Take 1 tablet (500 mg total) by mouth 2 (two) times daily. 11/27/22  Yes Kohen Reither, [place] N, FNP    Family History Family History  Problem Relation Age of Onset   Cancer Mother    Cancer Father    Diabetes  Father     Social History Social History   Tobacco Use   Smoking status: Never   Smokeless tobacco: Never  Vaping Use   Vaping status: Never Used  Substance Use Topics   Alcohol use: Yes    Comment: occassional   Drug use: No     Allergies   Patient has no known allergies.   Review of Systems Review of Systems  Per HPI   Physical Exam Triage Vital Signs ED Triage Vitals  Encounter Vitals Group     BP 11/27/22 1634 136/72     Systolic BP Percentile --      Diastolic BP Percentile --      Pulse Rate 11/27/22 1634 (!) 59     Resp 11/27/22 1634 17     Temp 11/27/22 1634 98.7 F (37.1 C)     Temp Source 11/27/22 1634 Oral     SpO2 11/27/22 1634 97 %     Weight --      Height 11/27/22 1632 5\' 10"  (1.778 m)     Head Circumference --  Peak Flow --      Pain Score 11/27/22 1632 6     Pain Loc --      Pain Education --      Exclude from Growth Chart --    No data found.  Updated Vital Signs BP 136/72 (BP Location: Right Arm)   Pulse (!) 59   Temp 98.7 F (37.1 C) (Oral)   Resp 17   Ht 5\' 10"  (1.778 m)   LMP 01/06/2015   SpO2 97%   BMI 19.51 kg/m   Visual Acuity Right Eye Distance:   Left Eye Distance:   Bilateral Distance:    Right Eye Near:   Left Eye Near:    Bilateral Near:     Physical Exam Vitals and nursing note reviewed.  Constitutional:      Appearance: Normal appearance.  HENT:     Head: Normocephalic and atraumatic.     Right Ear: External ear normal.     Left Ear: External ear normal.     Nose: Nose normal.     Mouth/Throat:     Mouth: Mucous membranes are moist.  Eyes:     Conjunctiva/sclera: Conjunctivae normal.  Cardiovascular:     Rate and Rhythm: Normal rate.  Pulmonary:     Effort: Pulmonary effort is normal. No respiratory distress.  Musculoskeletal:        General: No swelling, deformity or signs of injury. Normal range of motion.     Lumbar back: Tenderness present.       Back:     Comments: Endorses  tenderness to palpation, pain reproducible with movement.  Spine without step-off or deformity.  No spinal tenderness.  Atraumatic.  Pain does not radiate down her leg.  No inner leg numbness or incontinence.  Skin:    General: Skin is warm and dry.     Findings: No rash.  Neurological:     General: No focal deficit present.     Mental Status: She is alert and oriented to person, place, and time.  Psychiatric:        Mood and Affect: Mood normal.        Behavior: Behavior normal. Behavior is cooperative.      UC Treatments / Results  Labs (all labs ordered are listed, but only abnormal results are displayed) Labs Reviewed - No data to display  EKG   Radiology No results found.  Procedures Procedures (including critical care time)  Medications Ordered in UC Medications - No data to display  Initial Impression / Assessment and Plan / UC Course  I have reviewed the triage vital signs and the nursing notes.  Pertinent labs & imaging results that were available during my care of the patient were reviewed by me and considered in my medical decision making (see chart for details).  Vitals and triage reviewed, patient is hemodynamically stable.  Appears to have muscular right-sided lumbar back pain without sciatica.  Encouraged proper lifting technique.  Will trial muscle relaxers as needed.  Without red flag symptoms of trauma, cauda equina, or gait abnormalities.  Plan of care, follow-up care and return precautions given, no questions at this time.    Final Clinical Impressions(s) / UC Diagnoses   Final diagnoses:  Acute right-sided low back pain without sciatica     Discharge Instructions      Your back pain appears to be muscular.  Continue with your Epsom salt baths, warm compresses and gentle stretching.  You can use the muscle relaxer  up to 2 times daily, do not drink or drive on this medication as it may cause drowsiness.  Ensure you are using proper lifting technique  at work and taking your time with lifting.  Return to clinic for new or urgent symptoms.     ED Prescriptions     Medication Sig Dispense Auth. Provider   methocarbamol (ROBAXIN) 500 MG tablet Take 1 tablet (500 mg total) by mouth 2 (two) times daily. 20 tablet Solaris Kram, [place] N, [place]      PDMP not reviewed this encounter.   Sanai Frick, [place] N, [place] 11/27/22 [phone]

## 2022-12-12 ENCOUNTER — Encounter (HOSPITAL_COMMUNITY): Payer: Self-pay | Admitting: Emergency Medicine

## 2022-12-12 ENCOUNTER — Ambulatory Visit (HOSPITAL_COMMUNITY)
Admission: EM | Admit: 2022-12-12 | Discharge: 2022-12-12 | Disposition: A | Discharge status: Home / Self Care | Payer: Self-pay | Attending: Internal Medicine | Admitting: Internal Medicine

## 2022-12-12 DIAGNOSIS — M545 Low back pain, unspecified: Secondary | ICD-10-CM

## 2022-12-12 MED ORDER — PREDNISONE 10 MG PO TABS: 40.0000 mg | ORAL_TABLET | Freq: Every day | ORAL | 0 refills | Status: DC

## 2022-12-12 MED ORDER — METHOCARBAMOL 500 MG PO TABS: 500.0000 mg | ORAL_TABLET | Freq: Two times a day (BID) | ORAL | 0 refills | Status: DC | PRN

## 2022-12-12 NOTE — Discharge Instructions (Addendum)
Back pain with spasms. Will treat with the following:  Prednisone 40mg  (4 tablets) in the morning for 5 days. Take with food.  Methocarbamol (robaxin) 500mg  every 12 hours as needed for muscle spasms. This medication can cause you to be drowsy. Light stretching and heating pad for soreness Return to urgent care or PCP if symptoms worsen or fail to resolve.

## 2022-12-12 NOTE — ED Triage Notes (Signed)
Pt c/o lower back pain and a knot on the right side of her back that is very sore for a few weeks.

## 2022-12-12 NOTE — ED Provider Notes (Signed)
[facility]    CSN: [id number] Arrival date & time: 12/12/22  1657      History   Chief Complaint Chief Complaint  Patient presents with   Back Pain    HPI Lindsey Simpson is a 57 y.o. female.   56 year old female who presents to urgent care with complaints of right lower back pain.  This is a chronic issue for her and has had to have treatment several times.  She knows that she has arthritis in her back.  She does have radiating pain down the right leg at times.  Of recent concern she had a knotted area on the right lower back that she wanted evaluated to see if this was anything to worry about.  This has caused increased pain in the area.  She denies any bowel or bladder dysfunction.  She denies any issues with her legs giving out or numbness and tingling.   Back Pain   Past Medical History:  Diagnosis Date   Anemia    GERD (gastroesophageal reflux disease)     Patient Active Problem List   Diagnosis Date Noted   Musculoskeletal pain 08/12/2022   Acute right-sided back pain 08/12/2022   Bite, insect 10/15/2021   Fibroids, submucosal 02/05/2014   Symptomatic anemia 12/22/2013   Vaginal bleeding 12/22/2013   Hypotension 12/22/2013   UTI (urinary tract infection) 12/22/2013    Past Surgical History:  Procedure Laterality Date   TUBAL LIGATION      OB History     Gravida  6   Para  5   Term  5   Preterm  0   AB  1   Living  5      SAB  1   IAB  0   Ectopic  0   Multiple  0   Live Births               Home Medications    Prior to Admission medications   Medication Sig Start Date End Date Taking? Authorizing Provider  methocarbamol (ROBAXIN) 500 MG tablet Take 1 tablet (500 mg total) by mouth 2 (two) times daily. Patient not taking: Reported on 12/12/2022 11/27/22   Garrison, [place] N, FNP    Family History Family History  Problem Relation Age of Onset   Cancer Mother    Cancer Father    Diabetes Father      Social History Social History   Tobacco Use   Smoking status: Never   Smokeless tobacco: Never  Vaping Use   Vaping status: Never Used  Substance Use Topics   Alcohol use: Yes    Comment: occassional   Drug use: No     Allergies   Patient has no known allergies.   Review of Systems Review of Systems  Musculoskeletal:  Positive for back pain.     Physical Exam Triage Vital Signs ED Triage Vitals  Encounter Vitals Group     BP 12/12/22 1821 105/73     Systolic BP Percentile --      Diastolic BP Percentile --      Pulse Rate 12/12/22 1821 68     Resp 12/12/22 1821 17     Temp 12/12/22 1821 97.7 F (36.5 C)     Temp Source 12/12/22 1821 Oral     SpO2 12/12/22 1821 95 %     Weight --      Height --      Head Circumference --  Peak Flow --      Pain Score 12/12/22 1825 7     Pain Loc --      Pain Education --      Exclude from Growth Chart --    No data found.  Updated Vital Signs BP 105/73   Pulse 68   Temp 97.7 F (36.5 C) (Oral)   Resp 17   LMP 01/06/2015   SpO2 95%   Visual Acuity Right Eye Distance:   Left Eye Distance:   Bilateral Distance:    Right Eye Near:   Left Eye Near:    Bilateral Near:     Physical Exam Vitals and nursing note reviewed.  Constitutional:      General: She is not in acute distress.    Appearance: She is well-developed.  HENT:     Head: Normocephalic and atraumatic.  Eyes:     Conjunctiva/sclera: Conjunctivae normal.  Cardiovascular:     Rate and Rhythm: Normal rate and regular rhythm.     Heart sounds: No murmur heard. Pulmonary:     Effort: Pulmonary effort is normal. No respiratory distress.     Breath sounds: Normal breath sounds.  Abdominal:     Palpations: Abdomen is soft.     Tenderness: There is no abdominal tenderness.  Musculoskeletal:        General: No swelling.     Cervical back: Neck supple.       Back:  Skin:    General: Skin is warm and dry.     Capillary Refill: Capillary  refill takes less than 2 seconds.  Neurological:     Mental Status: She is alert.  Psychiatric:        Mood and Affect: Mood normal.      UC Treatments / Results  Labs (all labs ordered are listed, but only abnormal results are displayed) Labs Reviewed - No data to display  EKG   Radiology No results found.  Procedures Procedures (including critical care time)  Medications Ordered in UC Medications - No data to display  Initial Impression / Assessment and Plan / UC Course  I have reviewed the triage vital signs and the nursing notes.  Pertinent labs & imaging results that were available during my care of the patient were reviewed by me and considered in my medical decision making (see chart for details).     Acute right-sided low back pain without sciatica   Right lower back pain with muscle spasm which is likely the cause of the knotted area that the patient is feeling.  We have refilled the patient's muscle relaxer and advised her to use caution as this can cause sedation.  We will start her on prednisone 40 mg daily for 5 days to help with inflammation and pain.  Recommend that she use light stretching and heat for soreness.  Return to urgent care or PCP if symptoms worsen or fail to resolve.   Final Clinical Impressions(s) / UC Diagnoses   Final diagnoses:  None   Discharge Instructions   None    ED Prescriptions   None    PDMP not reviewed this encounter.   Landis Martins, [place] 12/12/22 1925

## 2022-12-27 ENCOUNTER — Encounter (HOSPITAL_COMMUNITY): Payer: Self-pay

## 2022-12-27 ENCOUNTER — Ambulatory Visit (HOSPITAL_COMMUNITY)
Admission: EM | Admit: 2022-12-27 | Discharge: 2022-12-27 | Disposition: A | Discharge status: Home / Self Care | Payer: Self-pay | Attending: Family Medicine | Admitting: Family Medicine

## 2022-12-27 DIAGNOSIS — R590 Localized enlarged lymph nodes: Secondary | ICD-10-CM

## 2022-12-27 DIAGNOSIS — H6123 Impacted cerumen, bilateral: Secondary | ICD-10-CM

## 2022-12-27 DIAGNOSIS — H9203 Otalgia, bilateral: Secondary | ICD-10-CM

## 2022-12-27 MED ORDER — AMOXICILLIN 875 MG PO TABS: 875.0000 mg | ORAL_TABLET | Freq: Two times a day (BID) | ORAL | 0 refills | Status: AC

## 2022-12-27 NOTE — ED Provider Notes (Signed)
[facility]    CSN: [id number] Arrival date & time: 12/27/22  1455      History   Chief Complaint Chief Complaint  Patient presents with   Otalgia    HPI Lindsey Simpson is a 57 y.o. female.    Otalgia  Patient is here for ear pain x several weeks.  Off/on.  No uri symptoms otherwise.  No hearing loss.  No fevers/chills.       Past Medical History:  Diagnosis Date   Anemia    GERD (gastroesophageal reflux disease)     Patient Active Problem List   Diagnosis Date Noted   Musculoskeletal pain 08/12/2022   Acute right-sided back pain 08/12/2022   Bite, insect 10/15/2021   Fibroids, submucosal 02/05/2014   Symptomatic anemia 12/22/2013   Vaginal bleeding 12/22/2013   Hypotension 12/22/2013   UTI (urinary tract infection) 12/22/2013    Past Surgical History:  Procedure Laterality Date   TUBAL LIGATION      OB History     Gravida  6   Para  5   Term  5   Preterm  0   AB  1   Living  5      SAB  1   IAB  0   Ectopic  0   Multiple  0   Live Births               Home Medications    Prior to Admission medications   Medication Sig Start Date End Date Taking? Authorizing Provider  methocarbamol (ROBAXIN) 500 MG tablet Take 1 tablet (500 mg total) by mouth every 12 (twelve) hours as needed for muscle spasms. 12/12/22   White, Elizabeth A, PA-C  predniSONE (DELTASONE) 10 MG tablet Take 4 tablets (40 mg total) by mouth daily with breakfast. 12/12/22   Landis Martins, PA-C    Family History Family History  Problem Relation Age of Onset   Cancer Mother    Cancer Father    Diabetes Father     Social History Social History   Tobacco Use   Smoking status: Never   Smokeless tobacco: Never  Vaping Use   Vaping status: Never Used  Substance Use Topics   Alcohol use: Yes    Comment: occassional   Drug use: No     Allergies   Patient has no known allergies.   Review of Systems Review of Systems   Constitutional: Negative.   HENT:  Positive for ear pain.   Respiratory: Negative.    Cardiovascular: Negative.   Gastrointestinal: Negative.   Musculoskeletal: Negative.   Psychiatric/Behavioral: Negative.       Physical Exam Triage Vital Signs ED Triage Vitals  Encounter Vitals Group     BP 12/27/22 1541 115/69     Systolic BP Percentile --      Diastolic BP Percentile --      Pulse Rate 12/27/22 1541 68     Resp 12/27/22 1541 16     Temp 12/27/22 1541 97.8 F (36.6 C)     Temp Source 12/27/22 1541 Oral     SpO2 12/27/22 1541 95 %     Weight 12/27/22 1541 135 lb (61.2 kg)     Height 12/27/22 1541 5\' 11"  (1.803 m)     Head Circumference --      Peak Flow --      Pain Score 12/27/22 1540 7     Pain Loc --      Pain  Education --      Exclude from [employer] Chart --    No data found.  Updated Vital Signs BP 115/69 (BP Location: Right Arm)   Pulse 68   Temp 97.8 F (36.6 C) (Oral)   Resp 16   Ht 5\' 11"  (1.803 m)   Wt 61.2 kg   LMP 01/06/2015   SpO2 95%   BMI 18.83 kg/m   Visual Acuity Right Eye Distance:   Left Eye Distance:   Bilateral Distance:    Right Eye Near:   Left Eye Near:    Bilateral Near:     Physical Exam Constitutional:      Appearance: Normal appearance.  HENT:     Ears:     Comments: Bilateral cerumen present;  appears soft;  unable to see TM;  patient is tender on exam;  no pain with movement of the pinna Neck:     Comments: There is one enlarged, tender LN at the right submandibular area Cardiovascular:     Rate and Rhythm: Normal rate and regular rhythm.  Pulmonary:     Effort: Pulmonary effort is normal.     Breath sounds: Normal breath sounds.  Musculoskeletal:     Cervical back: Normal range of motion and neck supple.  Neurological:     General: No focal deficit present.     Mental Status: She is alert.  Psychiatric:        Mood and Affect: Mood normal.      UC Treatments / Results  Labs (all labs ordered are listed,  but only abnormal results are displayed) Labs Reviewed - No data to display  EKG   Radiology No results found.  Procedures Procedures (including critical care time)  Medications Ordered in UC Medications - No data to display  Initial Impression / Assessment and Plan / UC Course  I have reviewed the triage vital signs and the nursing notes.  Pertinent labs & imaging results that were available during my care of the patient were reviewed by me and considered in my medical decision making (see chart for details).   Final Clinical Impressions(s) / UC Diagnoses   Final diagnoses:  Otalgia of both ears  Enlarged lymph node in neck  Cerumen debris on tympanic membrane of both ears     Discharge Instructions      You were seen today for ear pain and enlarged lymph node.  I am unable to see the ear drums due to increased wax, and because you are so sensitive on exam I do not think you could tolerate an ear cleaning today.  I have sent out an antibiotic today, primarily for the enlarged lymph node, but will also help if there is an ear infection.  If the enlarged lymph node does not improve, or if you continue with ear pain then please return for re-evaluation. You may need to have your ears cleaned out at that time.     ED Prescriptions     Medication Sig Dispense Auth. Provider   amoxicillin (AMOXIL) 875 MG tablet Take 1 tablet (875 mg total) by mouth 2 (two) times daily for 10 days. 20 tablet Jannifer Franklin, MD      PDMP not reviewed this encounter.   Jannifer Franklin, MD 12/27/22 [phone]

## 2022-12-27 NOTE — Discharge Instructions (Addendum)
You were seen today for ear pain and enlarged lymph node.  I am unable to see the ear drums due to increased wax, and because you are so sensitive on exam I do not think you could tolerate an ear cleaning today.  I have sent out an antibiotic today, primarily for the enlarged lymph node, but will also help if there is an ear infection.  If the enlarged lymph node does not improve, or if you continue with ear pain then please return for re-evaluation. You may need to have your ears cleaned out at that time.

## 2022-12-27 NOTE — ED Triage Notes (Signed)
Patient here today with c/o bilat ear pain X 2 weeks.

## 2023-02-06 ENCOUNTER — Encounter (HOSPITAL_COMMUNITY): Payer: Self-pay

## 2023-02-06 ENCOUNTER — Ambulatory Visit (HOSPITAL_COMMUNITY)
Admission: EM | Admit: 2023-02-06 | Discharge: 2023-02-06 | Disposition: A | Discharge status: Home / Self Care | Payer: Self-pay

## 2023-02-06 ENCOUNTER — Emergency Department (HOSPITAL_COMMUNITY)
Admission: EM | Admit: 2023-02-06 | Discharge: 2023-02-06 | Discharge status: Absent without leave | Payer: Self-pay | Source: Home / Self Care

## 2023-02-06 DIAGNOSIS — M79651 Pain in right thigh: Secondary | ICD-10-CM

## 2023-02-06 DIAGNOSIS — M79652 Pain in left thigh: Secondary | ICD-10-CM

## 2023-02-06 NOTE — Discharge Instructions (Signed)
Prednisone 2 tablets (previously prescribed) with food for 5 days. Methocarbamol 500 mg at bedtime as needed for leg tightness.

## 2023-02-06 NOTE — ED Triage Notes (Signed)
Patient is here for bilateral leg pain x 3 days. Patient has not taken any medication for relief.

## 2023-02-06 NOTE — ED Provider Notes (Signed)
[facility]    CSN: [id number] Arrival date & time: 02/06/23  1708      History   Chief Complaint Chief Complaint  Patient presents with   Leg Pain    HPI ADAEZE BETTER is a 58 y.o. female.    Leg Pain Patient with a history of anemia and GERD presents today with a complaint of bilateral lower leg pain x 3 days.  Patient has not had any known injury.  Has not taken any medications for her symptoms.  Past Medical History:  Diagnosis Date   Anemia    GERD (gastroesophageal reflux disease)     Patient Active Problem List   Diagnosis Date Noted   Musculoskeletal pain 08/12/2022   Acute right-sided back pain 08/12/2022   Bite, insect 10/15/2021   Fibroids, submucosal 02/05/2014   Symptomatic anemia 12/22/2013   Vaginal bleeding 12/22/2013   Hypotension 12/22/2013   UTI (urinary tract infection) 12/22/2013    Past Surgical History:  Procedure Laterality Date   TUBAL LIGATION      OB History     Gravida  6   Para  5   Term  5   Preterm  0   AB  1   Living  5      SAB  1   IAB  0   Ectopic  0   Multiple  0   Live Births               Home Medications    Prior to Admission medications   Not on File    Family History Family History  Problem Relation Age of Onset   Cancer Mother    Cancer Father    Diabetes Father     Social History Social History   Tobacco Use   Smoking status: Never   Smokeless tobacco: Never  Vaping Use   Vaping status: Never Used  Substance Use Topics   Alcohol use: Yes    Comment: occassional   Drug use: No     Allergies   Patient has no known allergies.   Review of Systems Review of Systems  Musculoskeletal:  Positive for myalgias.     Physical Exam Triage Vital Signs ED Triage Vitals  Encounter Vitals Group     BP 02/06/23 1817 117/81     Systolic BP Percentile --      Diastolic BP Percentile --      Pulse Rate 02/06/23 1816 69     Resp 02/06/23 1816 16     Temp  02/06/23 1817 97.8 F (36.6 C)     Temp Source 02/06/23 1816 Oral     SpO2 02/06/23 1816 95 %     Weight --      Height --      Head Circumference --      Peak Flow --      Pain Score --      Pain Loc --      Pain Education --      Exclude from Growth Chart --    No data found.  Updated Vital Signs BP 117/81 (BP Location: Left Arm)   Pulse 69   Temp 97.8 F (36.6 C) (Oral)   Resp 17   LMP 01/06/2015   SpO2 95%   Visual Acuity Right Eye Distance:   Left Eye Distance:   Bilateral Distance:    Right Eye Near:   Left Eye Near:    Bilateral Near:  Physical Exam Constitutional:      General: She is not in acute distress.    Appearance: Normal appearance. She is not ill-appearing.  HENT:     Head: Normocephalic and atraumatic.     Nose: Nose normal.  Eyes:     Extraocular Movements: Extraocular movements intact.     Conjunctiva/sclera: Conjunctivae normal.     Pupils: Pupils are equal, round, and reactive to light.  Cardiovascular:     Rate and Rhythm: Normal rate and regular rhythm.  Pulmonary:     Effort: Pulmonary effort is normal.     Breath sounds: Normal breath sounds.  Musculoskeletal:     Cervical back: Normal range of motion and neck supple.     Right upper leg: Tenderness present. No swelling or bony tenderness.     Left upper leg: Tenderness present. No swelling or bony tenderness.  Skin:    General: Skin is warm and dry.  Neurological:     General: No focal deficit present.     Mental Status: She is alert and oriented to person, place, and time.      UC Treatments / Results  Labs (all labs ordered are listed, but only abnormal results are displayed) Labs Reviewed - No data to display  EKG   Radiology No results found.  Procedures Procedures (including critical care time)  Medications Ordered in UC Medications - No data to display  Initial Impression / Assessment and Plan / UC Course  I have reviewed the triage vital signs and the  nursing notes.  Pertinent labs & imaging results that were available during my care of the patient were reviewed by me and considered in my medical decision making (see chart for details).    Bilateral thigh pain, no known etiology, anti-inflammatory failure, will treat with prednisone 40 mg daily x 5 days. Methocarbamol 500 mg at bedtime as needed for leg tightness. Follow-up with [facility] if symptoms worsen or do not improve the treatment.  Final Clinical Impressions(s) / UC Diagnoses   Final diagnoses:  Bilateral thigh pain     Discharge Instructions      Prednisone 2 tablets (previously prescribed) with food for 5 days. Methocarbamol 500 mg at bedtime as needed for leg tightness.     ED Prescriptions   None    PDMP not reviewed this encounter.   Bing Neighbors, NP 02/11/23 [phone]

## 2023-02-06 NOTE — ED Provider Notes (Signed)
Patient seen in triage and reports that less than an hour ago she was eating an apple and swallowed the corner of the apple and it is dislodged in her throat.  Patient was seen in triage advised to go to the emergency department she is in no distress she is speaking endorses severe discomfort in the esophageal area.  Patient is stable to go next-door to our emergency department.   Bing Neighbors, NP 02/06/23 1630

## 2023-04-14 ENCOUNTER — Encounter (HOSPITAL_COMMUNITY): Payer: Self-pay | Admitting: *Deleted

## 2023-04-14 ENCOUNTER — Ambulatory Visit (HOSPITAL_COMMUNITY)
Admission: EM | Admit: 2023-04-14 | Discharge: 2023-04-14 | Disposition: A | Discharge status: Home / Self Care | Payer: Self-pay | Attending: Emergency Medicine | Admitting: Emergency Medicine

## 2023-04-14 DIAGNOSIS — H9202 Otalgia, left ear: Secondary | ICD-10-CM

## 2023-04-14 DIAGNOSIS — H6123 Impacted cerumen, bilateral: Secondary | ICD-10-CM

## 2023-04-14 MED ORDER — CARBAMIDE PEROXIDE 6.5 % OT SOLN: 5.0000 [drp] | Freq: Two times a day (BID) | OTIC | 0 refills | Status: DC

## 2023-04-14 NOTE — ED Provider Notes (Signed)
 [facility]    CSN: [id number] Arrival date & time: 04/14/23  1446      History   Chief Complaint Chief Complaint  Patient presents with   Otalgia    HPI Lindsey Simpson is a 58 y.o. female.   Patient presents to clinic over concerns of intermittent left ear pain that has been ongoing for the past few months.  She had some drops in them but she did not pick this up.  She has been using peroxide intermittently, has not used anything in the past few days.  Has not had any drainage.  Denies fevers.  No current pain.  The history is provided by the patient and medical records.  Otalgia   Past Medical History:  Diagnosis Date   Anemia    GERD (gastroesophageal reflux disease)     Patient Active Problem List   Diagnosis Date Noted   Musculoskeletal pain 08/12/2022   Acute right-sided back pain 08/12/2022   Bite, insect 10/15/2021   Fibroids, submucosal 02/05/2014   Symptomatic anemia 12/22/2013   Vaginal bleeding 12/22/2013   Hypotension 12/22/2013   UTI (urinary tract infection) 12/22/2013    Past Surgical History:  Procedure Laterality Date   TUBAL LIGATION      OB History     Gravida  6   Para  5   Term  5   Preterm  0   AB  1   Living  5      SAB  1   IAB  0   Ectopic  0   Multiple  0   Live Births               Home Medications    Prior to Admission medications   Medication Sig Start Date End Date Taking? Authorizing Provider  carbamide peroxide (DEBROX) 6.5 % OTIC solution Place 5 drops into both ears 2 (two) times daily. 04/14/23  Yes Palmira Stickle, [place] N, FNP    Family History Family History  Problem Relation Age of Onset   Cancer Mother    Cancer Father    Diabetes Father     Social History Social History   Tobacco Use   Smoking status: Never   Smokeless tobacco: Never  Vaping Use   Vaping status: Never Used  Substance Use Topics   Alcohol use: Yes    Comment: occassional   Drug use: No      Allergies   Patient has no known allergies.   Review of Systems Review of Systems  Per HPI  Physical Exam Triage Vital Signs ED Triage Vitals  Encounter Vitals Group     BP 04/14/23 1530 101/63     Systolic BP Percentile --      Diastolic BP Percentile --      Pulse Rate 04/14/23 1530 63     Resp 04/14/23 1530 18     Temp 04/14/23 1530 98 F (36.7 C)     Temp Source 04/14/23 1530 Oral     SpO2 04/14/23 1530 97 %     Weight --      Height --      Head Circumference --      Peak Flow --      Pain Score 04/14/23 1529 6     Pain Loc --      Pain Education --      Exclude from Growth Chart --    No data found.  Updated Vital Signs BP 101/63 (BP  Location: Right Arm)   Pulse 63   Temp 98 F (36.7 C) (Oral)   Resp 18   LMP 01/06/2015   SpO2 97%   Visual Acuity Right Eye Distance:   Left Eye Distance:   Bilateral Distance:    Right Eye Near:   Left Eye Near:    Bilateral Near:     Physical Exam Vitals and nursing note reviewed.  Constitutional:      Appearance: Normal appearance.  HENT:     Head: Normocephalic and atraumatic.     Right Ear: There is impacted cerumen.     Left Ear: There is impacted cerumen.     Nose: Nose normal.     Mouth/Throat:     Mouth: Mucous membranes are moist.  Eyes:     Conjunctiva/sclera: Conjunctivae normal.  Cardiovascular:     Rate and Rhythm: Normal rate.  Pulmonary:     Effort: Pulmonary effort is normal. No respiratory distress.  Skin:    General: Skin is warm and dry.  Neurological:     General: No focal deficit present.     Mental Status: She is alert.  Psychiatric:        Mood and Affect: Mood normal.      UC Treatments / Results  Labs (all labs ordered are listed, but only abnormal results are displayed) Labs Reviewed - No data to display  EKG   Radiology No results found.  Procedures Procedures (including critical care time)  Medications Ordered in UC Medications - No data to  display  Initial Impression / Assessment and Plan / UC Course  I have reviewed the triage vital signs and the nursing notes.  Pertinent labs & imaging results that were available during my care of the patient were reviewed by me and considered in my medical decision making (see chart for details).  Vitals and triage reviewed, patient is hemodynamically stable.  Bilateral impacted cerumen on physical exam.  After irrigation, tympanic membranes are visualized pearly gray, with good cone of light.  Some cerumen still remains, encouraged wax softening drops.  No obvious otitis media or externa.  Plan of care, follow-up care return precautions given, no questions at this time.    Final Clinical Impressions(s) / UC Diagnoses   Final diagnoses:  Otalgia of left ear  Bilateral impacted cerumen     Discharge Instructions      We cleaned out your ears manually, you still have some earwax remaining.  Use the earwax drops daily to help loosen wax.  Return to clinic if you develop any pain or diminished hearing.     ED Prescriptions     Medication Sig Dispense Auth. Provider   carbamide peroxide (DEBROX) 6.5 % OTIC solution Place 5 drops into both ears 2 (two) times daily. 15 mL Deontrae Drinkard, [place] N, FNP      PDMP not reviewed this encounter.   Tjay Velazquez, [place] N, [place] 04/14/23 2027

## 2023-04-14 NOTE — ED Triage Notes (Signed)
 Pt states she has left ear pain on and off for awhile. She was seen in the past but didn't pick up her drops she tried to use peroxide instead. She isnt using any meds.

## 2023-04-14 NOTE — Discharge Instructions (Addendum)
 We cleaned out your ears manually, you still have some earwax remaining.  Use the earwax drops daily to help loosen wax.  Return to clinic if you develop any pain or diminished hearing.

## 2023-04-17 ENCOUNTER — Encounter (HOSPITAL_COMMUNITY): Payer: Self-pay

## 2023-04-17 ENCOUNTER — Ambulatory Visit (HOSPITAL_COMMUNITY)
Admission: EM | Admit: 2023-04-17 | Discharge: 2023-04-17 | Disposition: A | Discharge status: Home / Self Care | Payer: Self-pay | Attending: Family Medicine | Admitting: Family Medicine

## 2023-04-17 DIAGNOSIS — K529 Noninfective gastroenteritis and colitis, unspecified: Secondary | ICD-10-CM

## 2023-04-17 MED ORDER — ONDANSETRON 4 MG PO TBDP
ORAL_TABLET | ORAL | Status: AC
  Filled 2023-04-17: qty 1

## 2023-04-17 MED ORDER — ONDANSETRON 4 MG PO TBDP: 4.0000 mg | ORAL_TABLET | Freq: Three times a day (TID) | ORAL | 0 refills | Status: DC | PRN

## 2023-04-17 MED ORDER — ONDANSETRON 4 MG PO TBDP
4.0000 mg | ORAL_TABLET | Freq: Once | ORAL | Status: AC
  Administered 2023-04-17: 4 mg via ORAL

## 2023-04-17 NOTE — ED Provider Notes (Signed)
 [facility]    CSN: [id number] Arrival date & time: 04/17/23  1642      History   Chief Complaint Chief Complaint  Patient presents with   Abdominal Pain   Diarrhea   Dizziness    HPI Lindsey Simpson is a 58 y.o. female.    Abdominal Pain Associated symptoms: diarrhea   Diarrhea Associated symptoms: abdominal pain   Dizziness Associated symptoms: diarrhea   Here for abdominal pain that is mild and has gotten better.  She is also had diarrhea 3-4 times a day.  Symptoms began 2 days ago.  No vomiting but she has been nauseated.  No fever noted and no cough or congestion  NKDA  She is postmenopausal  No blood in the stool  Past Medical History:  Diagnosis Date   Anemia    GERD (gastroesophageal reflux disease)     Patient Active Problem List   Diagnosis Date Noted   Musculoskeletal pain 08/12/2022   Acute right-sided back pain 08/12/2022   Bite, insect 10/15/2021   Fibroids, submucosal 02/05/2014   Symptomatic anemia 12/22/2013   Vaginal bleeding 12/22/2013   Hypotension 12/22/2013   UTI (urinary tract infection) 12/22/2013    Past Surgical History:  Procedure Laterality Date   TUBAL LIGATION      OB History     Gravida  6   Para  5   Term  5   Preterm  0   AB  1   Living  5      SAB  1   IAB  0   Ectopic  0   Multiple  0   Live Births               Home Medications    Prior to Admission medications   Medication Sig Start Date End Date Taking? Authorizing Provider  ondansetron (ZOFRAN-ODT) 4 MG disintegrating tablet Take 1 tablet (4 mg total) by mouth every 8 (eight) hours as needed for nausea or vomiting. 04/17/23  Yes Glendoris Nodarse, Janace Aris, MD    Family History Family History  Problem Relation Age of Onset   Cancer Mother    Cancer Father    Diabetes Father     Social History Social History   Tobacco Use   Smoking status: Never   Smokeless tobacco: Never  Vaping Use   Vaping status: Never Used   Substance Use Topics   Alcohol use: Yes    Comment: occassional   Drug use: No     Allergies   Patient has no known allergies.   Review of Systems Review of Systems  Gastrointestinal:  Positive for abdominal pain and diarrhea.  Neurological:  Positive for dizziness.     Physical Exam Triage Vital Signs ED Triage Vitals  Encounter Vitals Group     BP 04/17/23 1839 (!) 110/59     Systolic BP Percentile --      Diastolic BP Percentile --      Pulse Rate 04/17/23 1839 61     Resp --      Temp 04/17/23 1839 98 F (36.7 C)     Temp Source 04/17/23 1839 Oral     SpO2 04/17/23 1839 93 %     Weight --      Height --      Head Circumference --      Peak Flow --      Pain Score 04/17/23 1757 7     Pain Loc --  Pain Education --      Exclude from Growth Chart --    No data found.  Updated Vital Signs BP (!) 110/59 (BP Location: Left Arm)   Pulse 61   Temp 98 F (36.7 C) (Oral)   LMP 01/06/2015   SpO2 93%   Visual Acuity Right Eye Distance:   Left Eye Distance:   Bilateral Distance:    Right Eye Near:   Left Eye Near:    Bilateral Near:     Physical Exam Vitals reviewed.  Constitutional:      General: She is not in acute distress.    Appearance: She is not ill-appearing, toxic-appearing or diaphoretic.  HENT:     Mouth/Throat:     Mouth: Mucous membranes are moist.  Cardiovascular:     Rate and Rhythm: Normal rate and regular rhythm.     Heart sounds: No murmur heard. Pulmonary:     Effort: Pulmonary effort is normal.     Breath sounds: Normal breath sounds.  Abdominal:     General: There is no distension.     Palpations: Abdomen is soft.     Tenderness: There is no abdominal tenderness. There is no guarding.  Musculoskeletal:     Cervical back: Neck supple.  Lymphadenopathy:     Cervical: No cervical adenopathy.  Skin:    Coloration: Skin is not pale.  Neurological:     General: No focal deficit present.     Mental Status: She is alert  and oriented to person, place, and time.  Psychiatric:        Behavior: Behavior normal.      UC Treatments / Results  Labs (all labs ordered are listed, but only abnormal results are displayed) Labs Reviewed - No data to display  EKG   Radiology No results found.  Procedures Procedures (including critical care time)  Medications Ordered in UC Medications  ondansetron (ZOFRAN-ODT) disintegrating tablet 4 mg (has no administration in time range)    Initial Impression / Assessment and Plan / UC Course  I have reviewed the triage vital signs and the nursing notes.  Pertinent labs & imaging results that were available during my care of the patient were reviewed by me and considered in my medical decision making (see chart for details).     1 dose of Zofran is given here as she would like to get to the pharmacy until tomorrow.  Zofran prescription is also sent to the pharmacy. Final Clinical Impressions(s) / UC Diagnoses   Final diagnoses:  Gastroenteritis     Discharge Instructions      Ondansetron dissolved in the mouth every 8 hours as needed for nausea or vomiting. Clear liquids(water, gatorade/pedialyte, ginger ale/sprite, chicken broth/soup) and bland things(crackers/toast, rice, potato, bananas) to eat. Avoid acidic foods like lemon/lime/orange/tomato, and avoid greasy/spicy foods.  We have given you 1 dose of this medication here in the clinic     ED Prescriptions     Medication Sig Dispense Auth. Provider   ondansetron (ZOFRAN-ODT) 4 MG disintegrating tablet Take 1 tablet (4 mg total) by mouth every 8 (eight) hours as needed for nausea or vomiting. 10 tablet Marlinda Mike Janace Aris, MD      PDMP not reviewed this encounter.   Zenia Resides, MD 04/17/23 [phone]

## 2023-04-17 NOTE — ED Triage Notes (Signed)
 Patient c/o generalized abdominal pain , diarrhea, and dizziness x 2-3 days.  Patient denies taking any medication for her symptoms.

## 2023-04-17 NOTE — Discharge Instructions (Signed)
 Ondansetron dissolved in the mouth every 8 hours as needed for nausea or vomiting. Clear liquids(water, gatorade/pedialyte, ginger ale/sprite, chicken broth/soup) and bland things(crackers/toast, rice, potato, bananas) to eat. Avoid acidic foods like lemon/lime/orange/tomato, and avoid greasy/spicy foods.  We have given you 1 dose of this medication here in the clinic

## 2023-04-29 ENCOUNTER — Ambulatory Visit (HOSPITAL_COMMUNITY)
Admission: EM | Admit: 2023-04-29 | Discharge: 2023-04-29 | Disposition: A | Discharge status: Home / Self Care | Attending: Emergency Medicine | Admitting: Emergency Medicine

## 2023-04-29 ENCOUNTER — Encounter (HOSPITAL_COMMUNITY): Payer: Self-pay

## 2023-04-29 DIAGNOSIS — H1013 Acute atopic conjunctivitis, bilateral: Secondary | ICD-10-CM | POA: Diagnosis not present

## 2023-04-29 MED ORDER — OLOPATADINE HCL 0.1 % OP SOLN: 1.0000 [drp] | Freq: Two times a day (BID) | OPHTHALMIC | 0 refills | Status: DC

## 2023-04-29 NOTE — ED Triage Notes (Signed)
 Patient here today with c/o itchy, watery eyes X 3-4 days. Patient also states that she has a little runny nose.

## 2023-04-29 NOTE — ED Provider Notes (Signed)
 [facility]    CSN: [id number] Arrival date & time: 04/29/23  1406      History   Chief Complaint Chief Complaint  Patient presents with   Eye Problem    HPI Lindsey Simpson is a 58 y.o. female.   Patient presents with itchy and watery eyes x 3 to 4 days.  Patient believes this is related to seasonal allergies.  Denies congestion, cough, headache, sore throat, blurred vision, photophobia.  Patient is requesting a work note for today.   Eye Problem   Past Medical History:  Diagnosis Date   Anemia    GERD (gastroesophageal reflux disease)     Patient Active Problem List   Diagnosis Date Noted   Musculoskeletal pain 08/12/2022   Acute right-sided back pain 08/12/2022   Bite, insect 10/15/2021   Fibroids, submucosal 02/05/2014   Symptomatic anemia 12/22/2013   Vaginal bleeding 12/22/2013   Hypotension 12/22/2013   UTI (urinary tract infection) 12/22/2013    Past Surgical History:  Procedure Laterality Date   TUBAL LIGATION      OB History     Gravida  6   Para  5   Term  5   Preterm  0   AB  1   Living  5      SAB  1   IAB  0   Ectopic  0   Multiple  0   Live Births               Home Medications    Prior to Admission medications   Medication Sig Start Date End Date Taking? Authorizing Provider  olopatadine (PATADAY) 0.1 % ophthalmic solution Place 1 drop into both eyes 2 (two) times daily. 04/29/23  Yes Levora Reas A, NP  ondansetron (ZOFRAN-ODT) 4 MG disintegrating tablet Take 1 tablet (4 mg total) by mouth every 8 (eight) hours as needed for nausea or vomiting. 04/17/23   Ellsworth Haas, Paige Boatman, MD    Family History Family History  Problem Relation Age of Onset   Cancer Mother    Cancer Father    Diabetes Father     Social History Social History   Tobacco Use   Smoking status: Never   Smokeless tobacco: Never  Vaping Use   Vaping status: Never Used  Substance Use Topics   Alcohol use: Yes     Comment: occassional   Drug use: No     Allergies   Patient has no known allergies.   Review of Systems Review of Systems  Per HPI  Physical Exam Triage Vital Signs ED Triage Vitals  Encounter Vitals Group     BP 04/29/23 1421 122/79     Systolic BP Percentile --      Diastolic BP Percentile --      Pulse Rate 04/29/23 1421 61     Resp 04/29/23 1421 16     Temp 04/29/23 1421 98.2 F (36.8 C)     Temp Source 04/29/23 1421 Oral     SpO2 04/29/23 1421 98 %     Weight 04/29/23 1421 142 lb (64.4 kg)     Height --      Head Circumference --      Peak Flow --      Pain Score 04/29/23 1422 0     Pain Loc --      Pain Education --      Exclude from Growth Chart --    No data found.  Updated Vital  Signs BP 122/79 (BP Location: Right Arm)   Pulse 61   Temp 98.2 F (36.8 C) (Oral)   Resp 16   Wt 142 lb (64.4 kg)   LMP 01/06/2015   SpO2 98%   BMI 19.80 kg/m   Visual Acuity Right Eye Distance:   Left Eye Distance:   Bilateral Distance:    Right Eye Near:   Left Eye Near:    Bilateral Near:     Physical Exam Vitals and nursing note reviewed.  Constitutional:      General: She is awake. She is not in acute distress.    Appearance: Normal appearance. She is well-developed and well-groomed. She is not ill-appearing.  HENT:     Nose: Nose normal.  Eyes:     General: Lids are normal.     Extraocular Movements: Extraocular movements intact.     Conjunctiva/sclera: Conjunctivae normal.     Pupils: Pupils are equal, round, and reactive to light.  Skin:    General: Skin is warm and dry.  Neurological:     Mental Status: She is alert.  Psychiatric:        Behavior: Behavior is cooperative.      UC Treatments / Results  Labs (all labs ordered are listed, but only abnormal results are displayed) Labs Reviewed - No data to display  EKG   Radiology No results found.  Procedures Procedures (including critical care time)  Medications Ordered in  UC Medications - No data to display  Initial Impression / Assessment and Plan / UC Course  I have reviewed the triage vital signs and the nursing notes.  Pertinent labs & imaging results that were available during my care of the patient were reviewed by me and considered in my medical decision making (see chart for details).     Patient is well-appearing.  Vitals are stable.  No significant findings upon exam.  EOMI and PERRLA.  Prescribed Pataday as needed for allergic conjunctivitis.  Discussed return precautions.  Provided with work note. Final Clinical Impressions(s) / UC Diagnoses   Final diagnoses:  Allergic conjunctivitis of both eyes     Discharge Instructions      Use Pataday drops twice daily as needed for itchy and watery eyes. Return here if symptoms persist or worsen.   ED Prescriptions     Medication Sig Dispense Auth. Provider   olopatadine (PATADAY) 0.1 % ophthalmic solution Place 1 drop into both eyes 2 (two) times daily. 5 mL Levora Reas A, NP      PDMP not reviewed this encounter.   Levora Reas A, NP 04/29/23 [phone]

## 2023-04-29 NOTE — Discharge Instructions (Signed)
 Use Pataday drops twice daily as needed for itchy and watery eyes. Return here if symptoms persist or worsen.

## 2023-05-08 ENCOUNTER — Ambulatory Visit (HOSPITAL_COMMUNITY)
Admission: EM | Admit: 2023-05-08 | Discharge: 2023-05-08 | Disposition: A | Discharge status: Home / Self Care | Attending: Physician Assistant | Admitting: Physician Assistant

## 2023-05-08 ENCOUNTER — Encounter (HOSPITAL_COMMUNITY): Payer: Self-pay

## 2023-05-08 DIAGNOSIS — R252 Cramp and spasm: Secondary | ICD-10-CM | POA: Insufficient documentation

## 2023-05-08 LAB — COMPREHENSIVE METABOLIC PANEL WITH GFR
ALT: 16 U/L (ref 0–44)
AST: 21 U/L (ref 15–41)
Albumin: 3.6 g/dL (ref 3.5–5.0)
Alkaline Phosphatase: 73 U/L (ref 38–126)
Anion gap: 9 (ref 5–15)
BUN: 19 mg/dL (ref 6–20)
CO2: 22 mmol/L (ref 22–32)
Calcium: 9 mg/dL (ref 8.9–10.3)
Chloride: 105 mmol/L (ref 98–111)
Creatinine, Ser: 0.72 mg/dL (ref 0.44–1.00)
GFR, Estimated: >60 mL/min (ref >60)
Glucose, Bld: 74 mg/dL (ref 70–99)
Potassium: 4.1 mmol/L (ref 3.5–5.1)
Sodium: 136 mmol/L (ref 135–145)
Total Bilirubin: 1 mg/dL (ref 0.0–1.2)
Total Protein: 7.4 g/dL (ref 6.5–8.1)

## 2023-05-08 LAB — CBC WITH DIFFERENTIAL/PLATELET
Abs Immature Granulocytes: 0.01 10*3/uL (ref 0.00–0.07)
Basophils Absolute: 0 10*3/uL (ref 0.0–0.1)
Basophils Relative: 1 %
Eosinophils Absolute: 0.2 10*3/uL (ref 0.0–0.5)
Eosinophils Relative: 4 %
HCT: 40.4 % (ref 36.0–46.0)
Hemoglobin: 13.5 g/dL (ref 12.0–15.0)
Immature Granulocytes: 0 %
Lymphocytes Relative: 21 %
Lymphs Abs: 1.1 10*3/uL (ref 0.7–4.0)
MCH: 31.1 pg (ref 26.0–34.0)
MCHC: 33.4 g/dL (ref 30.0–36.0)
MCV: 93.1 fL (ref 80.0–100.0)
Monocytes Absolute: 0.5 10*3/uL (ref 0.1–1.0)
Monocytes Relative: 10 %
Neutro Abs: 3.4 10*3/uL (ref 1.7–7.7)
Neutrophils Relative %: 64 %
Platelets: 269 10*3/uL (ref 150–400)
RBC: 4.34 MIL/uL (ref 3.87–5.11)
RDW: 12.8 % (ref 11.5–15.5)
WBC: 5.3 10*3/uL (ref 4.0–10.5)
nRBC: 0 % (ref 0.0–0.2)

## 2023-05-08 LAB — CK: Total CK: 84 U/L (ref 38–234)

## 2023-05-08 MED ORDER — BACLOFEN 5 MG PO TABS: 5.0000 mg | ORAL_TABLET | Freq: Every evening | ORAL | 0 refills | Status: DC | PRN

## 2023-05-08 NOTE — ED Provider Notes (Signed)
 [facility]    CSN: [id number] Arrival date & time: 05/08/23  1725      History   Chief Complaint Chief Complaint  Patient presents with   Toe Pain    HPI Lindsey Simpson is a 58 y.o. female.   Patient presents today with a prolonged history (many months) of intermittent feet and leg cramping.  She is currently asymptomatic but reports that episodes have been increasing in frequency and intensity and she was experiencing the pain earlier prompting evaluation.  During episodes, pain is rated 6 on a 0-10 pain scale, described as a cramping, no aggravating relieving factors identified.  She has tried Tylenol  which has been ineffective in managing her symptoms.  She does wear large steel toed boots at work and thinks that this might be contributing to her symptoms as she has noticed an increase in frequency of symptoms following her shift.  She denies any numbness or paresthesias.  Denies any leg swelling, denies any fever, chest pain, shortness of breath.  She reports eating a healthy diet and has not had any medication changes.  Denies any change in her activity including increased ambulation.  Symptoms are slightly more frequent at night but she does have them throughout the day.  She denies any known injury.  Reports that symptoms are symmetric and involve both feet and lower legs.    Past Medical History:  Diagnosis Date   Anemia    GERD (gastroesophageal reflux disease)     Patient Active Problem List   Diagnosis Date Noted   Musculoskeletal pain 08/12/2022   Acute right-sided back pain 08/12/2022   Bite, insect 10/15/2021   Fibroids, submucosal 02/05/2014   Symptomatic anemia 12/22/2013   Vaginal bleeding 12/22/2013   Hypotension 12/22/2013   UTI (urinary tract infection) 12/22/2013    Past Surgical History:  Procedure Laterality Date   TUBAL LIGATION      OB History     Gravida  6   Para  5   Term  5   Preterm  0   AB  1   Living  5       SAB  1   IAB  0   Ectopic  0   Multiple  0   Live Births               Home Medications    Prior to Admission medications   Medication Sig Start Date End Date Taking? Authorizing Provider  Baclofen  5 MG TABS Take 1 tablet (5 mg total) by mouth at bedtime as needed. 05/08/23  Yes Roye Gustafson K, PA-C  olopatadine  (PATADAY ) 0.1 % ophthalmic solution Place 1 drop into both eyes 2 (two) times daily. 04/29/23   Levora Reas A, NP  ondansetron  (ZOFRAN -ODT) 4 MG disintegrating tablet Take 1 tablet (4 mg total) by mouth every 8 (eight) hours as needed for nausea or vomiting. 04/17/23   Ellsworth Haas, Paige Boatman, MD    Family History Family History  Problem Relation Age of Onset   Cancer Mother    Cancer Father    Diabetes Father     Social History Social History   Tobacco Use   Smoking status: Never   Smokeless tobacco: Never  Vaping Use   Vaping status: Never Used  Substance Use Topics   Alcohol use: Yes    Comment: occassional   Drug use: No     Allergies   Patient has no known allergies.   Review of Systems Review  of Systems  Constitutional:  Positive for activity change. Negative for appetite change, fatigue and fever.  Musculoskeletal:  Positive for arthralgias, gait problem and myalgias. Negative for joint swelling.  Skin:  Negative for color change and wound.  Neurological:  Negative for weakness and numbness.     Physical Exam Triage Vital Signs ED Triage Vitals [05/08/23 1803]  Encounter Vitals Group     BP (!) 143/81     Systolic BP Percentile      Diastolic BP Percentile      Pulse Rate 67     Resp 18     Temp 98.1 F (36.7 C)     Temp Source Oral     SpO2 96 %     Weight      Height      Head Circumference      Peak Flow      Pain Score 6     Pain Loc      Pain Education      Exclude from Growth Chart    No data found.  Updated Vital Signs BP (!) 143/81 (BP Location: Left Arm)   Pulse 67   Temp 98.1 F (36.7 C) (Oral)   Resp 18    LMP 01/06/2015   SpO2 96%   Visual Acuity Right Eye Distance:   Left Eye Distance:   Bilateral Distance:    Right Eye Near:   Left Eye Near:    Bilateral Near:     Physical Exam Vitals reviewed.  Constitutional:      General: She is awake. She is not in acute distress.    Appearance: Normal appearance. She is well-developed. She is not ill-appearing.     Comments: Very pleasant female appears stated age in no acute distress sitting comfortably in exam room  HENT:     Head: Normocephalic and atraumatic.  Cardiovascular:     Rate and Rhythm: Normal rate and regular rhythm.     Heart sounds: Normal heart sounds, S1 normal and S2 normal. No murmur heard.    Comments: Capillary refill within 2 seconds bilateral toes Pulmonary:     Effort: Pulmonary effort is normal.     Breath sounds: Normal breath sounds. No wheezing, rhonchi or rales.     Comments: Clear to auscultation bilaterally Musculoskeletal:     Right lower leg: Tenderness present. No bony tenderness. No edema.     Left lower leg: Tenderness present. No bony tenderness. No edema.     Right foot: Normal capillary refill. Bunion present. No swelling or bony tenderness.     Left foot: Normal capillary refill. Bunion present. No swelling or bony tenderness.     Comments: Feet: Feet are neurovascularly intact.  Bilateral bunions but patient reports that pain is involving her entire foot and not localized to this area.  No additional deformity.  No focal bony tenderness.  Normal active range of motion of toes and at ankle.  Lower legs: Mild tenderness palpation of bilateral gastrocnemius muscles.  Negative Homans' sign.  No pitting edema and calf sizes are symmetric.  Psychiatric:        Behavior: Behavior is cooperative.      UC Treatments / Results  Labs (all labs ordered are listed, but only abnormal results are displayed) Labs Reviewed  CBC WITH DIFFERENTIAL/PLATELET  COMPREHENSIVE METABOLIC PANEL WITH GFR  CK     EKG   Radiology No results found.  Procedures Procedures (including critical care time)  Medications Ordered in  UC Medications - No data to display  Initial Impression / Assessment and Plan / UC Course  I have reviewed the triage vital signs and the nursing notes.  Pertinent labs & imaging results that were available during my care of the patient were reviewed by me and considered in my medical decision making (see chart for details).     Patient is well-appearing, afebrile, nontoxic, nontachycardic.  Unclear etiology of symptoms though we did discuss that this could be related to an electrolyte imbalance, iron deficiency, other muscle injury.  Will obtain basic blood work including CBC, CMP, CK and contact her if any of this is abnormal.  Recommended that she push fluids including electrolyte solutions particularly when she has been working.  She was given a low-dose muscle relaxer to help manage her pain but we discussed that this can be sedating and she is not to drive or drink alcohol taking it.  Plain films were deferred as she denies any recent trauma and has a symmetric pain involving her feet and calves without focal bony tenderness.  We did discuss that it is possible symptoms could be related to arthritis as she does have a history of OA but that we would not change her management even if this was found on x-ray.  We discussed that if her symptoms or not improving or if anything worsens she needs to be seen immediately.  Strict return precautions given.  Excuse note provided.  Final Clinical Impressions(s) / UC Diagnoses   Final diagnoses:  Foot cramps  Leg cramps     Discharge Instructions      I will contact you if any of your blood work is abnormal.  Make sure that you are drinking plenty of fluid particularly when you are at work.  I also recommend using an electrolyte solution in case an electrolyte imbalance is contributing to your cramping.  Take baclofen  at  night.  This will make you sleepy so do not drive or drink alcohol with taking it.  If anything worsens you have increasing pain, difficulty walking, numbness or tingling you should return for reevaluation.     ED Prescriptions     Medication Sig Dispense Auth. Provider   Baclofen  5 MG TABS Take 1 tablet (5 mg total) by mouth at bedtime as needed. 10 tablet Janaia Kozel K, PA-C      PDMP not reviewed this encounter.   Budd Cargo, PA-C 05/08/23 1848

## 2023-05-08 NOTE — ED Triage Notes (Signed)
 Pt c/o rt toe cramping and curling up intermittent for months. Denies injury. States takes tylenol  sometimes.

## 2023-05-08 NOTE — Discharge Instructions (Addendum)
 I will contact you if any of your blood work is abnormal.  Make sure that you are drinking plenty of fluid particularly when you are at work.  I also recommend using an electrolyte solution in case an electrolyte imbalance is contributing to your cramping.  Take baclofen  at night.  This will make you sleepy so do not drive or drink alcohol with taking it.  If anything worsens you have increasing pain, difficulty walking, numbness or tingling you should return for reevaluation.

## 2023-05-09 ENCOUNTER — Encounter (HOSPITAL_COMMUNITY): Payer: Self-pay | Admitting: Physician Assistant

## 2023-05-18 ENCOUNTER — Encounter (HOSPITAL_COMMUNITY): Payer: Self-pay | Admitting: Emergency Medicine

## 2023-05-18 ENCOUNTER — Ambulatory Visit (HOSPITAL_COMMUNITY)
Admission: EM | Admit: 2023-05-18 | Discharge: 2023-05-18 | Disposition: A | Discharge status: Home / Self Care | Attending: Emergency Medicine | Admitting: Emergency Medicine

## 2023-05-18 DIAGNOSIS — S29012A Strain of muscle and tendon of back wall of thorax, initial encounter: Secondary | ICD-10-CM | POA: Diagnosis not present

## 2023-05-18 MED ORDER — NAPROXEN 375 MG PO TABS: 375.0000 mg | ORAL_TABLET | Freq: Two times a day (BID) | ORAL | 0 refills | Status: DC

## 2023-05-18 MED ORDER — LIDOCAINE 5 % EX PTCH: 1.0000 | MEDICATED_PATCH | CUTANEOUS | 0 refills | Status: DC

## 2023-05-18 NOTE — ED Triage Notes (Signed)
 Pt reports that she has back problems. Reports for past week harder to bend over and get back up due to pain and stiffness.

## 2023-05-18 NOTE — ED Provider Notes (Signed)
 [facility]    CSN: [id number] Arrival date & time: 05/18/23  1657      History   Chief Complaint Chief Complaint  Patient presents with   Back Pain    HPI Lindsey Simpson is a 58 y.o. female.   Patient presents with left upper back pain that has been bothering her for a few days.  Patient states that over the past few days when she bends over and gets back up she has increased pain and stiffness to her left upper back.  Denies any fall or recent injury.  Patient reports that she has history of chronic back pain.  Denies taking any medication for pain.  Denies numbness, tingling, weakness, or decreased range of motion.  The history is provided by the patient and medical records.  Back Pain   Past Medical History:  Diagnosis Date   Anemia    GERD (gastroesophageal reflux disease)     Patient Active Problem List   Diagnosis Date Noted   Musculoskeletal pain 08/12/2022   Acute right-sided back pain 08/12/2022   Bite, insect 10/15/2021   Fibroids, submucosal 02/05/2014   Symptomatic anemia 12/22/2013   Vaginal bleeding 12/22/2013   Hypotension 12/22/2013   UTI (urinary tract infection) 12/22/2013    Past Surgical History:  Procedure Laterality Date   TUBAL LIGATION      OB History     Gravida  6   Para  5   Term  5   Preterm  0   AB  1   Living  5      SAB  1   IAB  0   Ectopic  0   Multiple  0   Live Births               Home Medications    Prior to Admission medications   Medication Sig Start Date End Date Taking? Authorizing Provider  lidocaine  (LIDODERM ) 5 % Place 1 patch onto the skin daily. Remove & Discard patch within 12 hours or as directed by MD 05/18/23  Yes Levora Reas A, NP  naproxen  (NAPROSYN ) 375 MG tablet Take 1 tablet (375 mg total) by mouth 2 (two) times daily. 05/18/23  Yes Shawnmichael Parenteau A, NP  Baclofen  5 MG TABS Take 1 tablet (5 mg total) by mouth at bedtime as needed. 05/08/23   Raspet, Erin K,  PA-C  olopatadine  (PATADAY ) 0.1 % ophthalmic solution Place 1 drop into both eyes 2 (two) times daily. 04/29/23   Karon Packer, NP  ondansetron  (ZOFRAN -ODT) 4 MG disintegrating tablet Take 1 tablet (4 mg total) by mouth every 8 (eight) hours as needed for nausea or vomiting. 04/17/23   Ellsworth Haas, Paige Boatman, MD    Family History Family History  Problem Relation Age of Onset   Cancer Mother    Cancer Father    Diabetes Father     Social History Social History   Tobacco Use   Smoking status: Never   Smokeless tobacco: Never  Vaping Use   Vaping status: Never Used  Substance Use Topics   Alcohol use: Yes    Comment: occassional   Drug use: No     Allergies   Patient has no known allergies.   Review of Systems Review of Systems  Musculoskeletal:  Positive for back pain.   Per HPI  Physical Exam Triage Vital Signs ED Triage Vitals [05/18/23 1728]  Encounter Vitals Group     BP 132/82  Systolic BP Percentile      Diastolic BP Percentile      Pulse Rate 61     Resp 14     Temp 97.8 F (36.6 C)     Temp Source Oral     SpO2 97 %     Weight      Height      Head Circumference      Peak Flow      Pain Score      Pain Loc      Pain Education      Exclude from Growth Chart    No data found.  Updated Vital Signs BP 132/82 (BP Location: Left Arm)   Pulse 61   Temp 97.8 F (36.6 C) (Oral)   Resp 14   LMP 01/06/2015   SpO2 97%   Visual Acuity Right Eye Distance:   Left Eye Distance:   Bilateral Distance:    Right Eye Near:   Left Eye Near:    Bilateral Near:     Physical Exam Vitals and nursing note reviewed.  Constitutional:      General: She is awake. She is not in acute distress.    Appearance: Normal appearance. She is well-developed and well-groomed. She is not ill-appearing.  Musculoskeletal:     Cervical back: Normal.     Thoracic back: Tenderness present. No swelling, edema, deformity, spasms or bony tenderness. Normal range of  motion.     Lumbar back: Normal.     Comments: Mild tenderness noted to left thoracic region  Skin:    General: Skin is warm and dry.  Neurological:     Mental Status: She is alert.  Psychiatric:        Behavior: Behavior is cooperative.      UC Treatments / Results  Labs (all labs ordered are listed, but only abnormal results are displayed) Labs Reviewed - No data to display  EKG   Radiology No results found.  Procedures Procedures (including critical care time)  Medications Ordered in UC Medications - No data to display  Initial Impression / Assessment and Plan / UC Course  I have reviewed the triage vital signs and the nursing notes.  Pertinent labs & imaging results that were available during my care of the patient were reviewed by me and considered in my medical decision making (see chart for details).     Patient is well-appearing.  Vital stable.  Upon assessment  mild tenderness noted to left thoracic region, without decreased range of motion, bony tenderness, spasm, or deformity present.  Patient is able to bend over and touch her toes without increase in pain at this time.  Prescribed naproxen  and lidocaine  patches as needed for pain.  Recommended alternating with Tylenol  as needed for pain.  Given orthopedic follow-up.  Discussed return precautions.  Provided patient with work note as requested. Final Clinical Impressions(s) / UC Diagnoses   Final diagnoses:  Strain of thoracic back region     Discharge Instructions      Take naproxen  twice daily as needed for pain.  Do not take this with other NSAIDs including ibuprofen , Motrin , Advil , Aleve , and Goody powder. Apply lidocaine  patch once daily for 12 hours at a time to help with pain. Otherwise you can take 650 mg of Tylenol  every 4-6 hours as needed for breakthrough pain. Alternate between heat and ice and do some gentle stretching to prevent further pain and stiffness. I have attached [employer]  sports [facility]  that you can follow-up with if your pain persists. Return here as needed.   ED Prescriptions     Medication Sig Dispense Auth. Provider   lidocaine  (LIDODERM ) 5 % Place 1 patch onto the skin daily. Remove & Discard patch within 12 hours or as directed by MD 30 patch Levora Reas A, NP   naproxen  (NAPROSYN ) 375 MG tablet Take 1 tablet (375 mg total) by mouth 2 (two) times daily. 20 tablet Levora Reas A, NP      PDMP not reviewed this encounter.   Levora Reas A, NP 05/18/23 1815

## 2023-05-18 NOTE — Discharge Instructions (Addendum)
 Take naproxen  twice daily as needed for pain.  Do not take this with other NSAIDs including ibuprofen , Motrin , Advil , Aleve , and Goody powder. Apply lidocaine  patch once daily for 12 hours at a time to help with pain. Otherwise you can take 650 mg of Tylenol  every 4-6 hours as needed for breakthrough pain. Alternate between heat and ice and do some gentle stretching to prevent further pain and stiffness. I have attached [employer] [facility] that you can follow-up with if your pain persists. Return here as needed.

## 2023-05-22 ENCOUNTER — Ambulatory Visit (HOSPITAL_COMMUNITY)
Admission: EM | Admit: 2023-05-22 | Discharge: 2023-05-22 | Disposition: A | Discharge status: Home / Self Care | Attending: Internal Medicine | Admitting: Internal Medicine

## 2023-05-22 ENCOUNTER — Encounter (HOSPITAL_COMMUNITY): Payer: Self-pay | Admitting: Emergency Medicine

## 2023-05-22 DIAGNOSIS — R110 Nausea: Secondary | ICD-10-CM | POA: Diagnosis not present

## 2023-05-22 MED ORDER — ONDANSETRON 4 MG PO TBDP
4.0000 mg | ORAL_TABLET | Freq: Once | ORAL | Status: AC
  Administered 2023-05-22: 4 mg via ORAL

## 2023-05-22 MED ORDER — ONDANSETRON 4 MG PO TBDP
ORAL_TABLET | ORAL | Status: AC
  Filled 2023-05-22: qty 1

## 2023-05-22 NOTE — Discharge Instructions (Addendum)
 Nausea likely secondary to mild dehydration after drinking alcoholic beverages.  No other concerning symptoms at this time.  We have given you a dose of Zofran  4 mg to help with the nausea.  Recommend continuing to drink plenty of water and to use electrolytes.  If symptoms worsen or fail to completely resolve follow-up with urgent care.

## 2023-05-22 NOTE — ED Triage Notes (Addendum)
 Pt report "I drink some last night and woke with nausea. Denis vomiting. Pt eating and drinking in lobby and triage when called back to room without any problems.

## 2023-05-22 NOTE — ED Provider Notes (Signed)
 [facility]    CSN: [id number] Arrival date & time: 05/22/23  1658      History   Chief Complaint Chief Complaint  Patient presents with   Nausea    HPI Lindsey Simpson is a 58 y.o. female.   58 year old female who presents urgent care with complaints of nausea.  She reports that last night she drank several alcoholic beverages.  This morning she woke up very nauseated.  She has not vomited.  She has been eating bananas and drinking plenty of water.  She reports that nausea is a lot better now but not completely resolved.  She denies any abdominal pain, headache, fevers, chills, dysuria, diarrhea, vomiting. She is requesting a note for work     Past Medical History:  Diagnosis Date   Anemia    GERD (gastroesophageal reflux disease)     Patient Active Problem List   Diagnosis Date Noted   Musculoskeletal pain 08/12/2022   Acute right-sided back pain 08/12/2022   Bite, insect 10/15/2021   Fibroids, submucosal 02/05/2014   Symptomatic anemia 12/22/2013   Vaginal bleeding 12/22/2013   Hypotension 12/22/2013   UTI (urinary tract infection) 12/22/2013    Past Surgical History:  Procedure Laterality Date   TUBAL LIGATION      OB History     Gravida  6   Para  5   Term  5   Preterm  0   AB  1   Living  5      SAB  1   IAB  0   Ectopic  0   Multiple  0   Live Births               Home Medications    Prior to Admission medications   Medication Sig Start Date End Date Taking? Authorizing Provider  Baclofen  5 MG TABS Take 1 tablet (5 mg total) by mouth at bedtime as needed. 05/08/23   Raspet, Erin K, PA-C  lidocaine  (LIDODERM ) 5 % Place 1 patch onto the skin daily. Remove & Discard patch within 12 hours or as directed by MD 05/18/23   Karon Packer, NP  naproxen  (NAPROSYN ) 375 MG tablet Take 1 tablet (375 mg total) by mouth 2 (two) times daily. 05/18/23   Levora Reas A, NP  olopatadine  (PATADAY ) 0.1 % ophthalmic solution Place  1 drop into both eyes 2 (two) times daily. 04/29/23   Levora Reas A, NP  ondansetron  (ZOFRAN -ODT) 4 MG disintegrating tablet Take 1 tablet (4 mg total) by mouth every 8 (eight) hours as needed for nausea or vomiting. 04/17/23   Ellsworth Haas, Paige Boatman, MD    Family History Family History  Problem Relation Age of Onset   Cancer Mother    Cancer Father    Diabetes Father     Social History Social History   Tobacco Use   Smoking status: Never   Smokeless tobacco: Never  Vaping Use   Vaping status: Never Used  Substance Use Topics   Alcohol use: Yes    Comment: occassional   Drug use: No     Allergies   Patient has no known allergies.   Review of Systems Review of Systems  Constitutional:  Negative for chills and fever.  HENT:  Negative for ear pain and sore throat.   Eyes:  Negative for pain and visual disturbance.  Respiratory:  Negative for cough and shortness of breath.   Cardiovascular:  Negative for chest pain and palpitations.  Gastrointestinal:  Positive for nausea. Negative for abdominal pain and vomiting.  Genitourinary:  Negative for dysuria and hematuria.  Musculoskeletal:  Negative for arthralgias and back pain.  Skin:  Negative for color change and rash.  Neurological:  Negative for seizures and syncope.  All other systems reviewed and are negative.    Physical Exam Triage Vital Signs ED Triage Vitals  Encounter Vitals Group     BP 05/22/23 1812 116/74     Systolic BP Percentile --      Diastolic BP Percentile --      Pulse Rate 05/22/23 1812 80     Resp 05/22/23 1812 16     Temp 05/22/23 1812 98.4 F (36.9 C)     Temp Source 05/22/23 1812 Oral     SpO2 05/22/23 1812 97 %     Weight --      Height --      Head Circumference --      Peak Flow --      Pain Score 05/22/23 1811 0     Pain Loc --      Pain Education --      Exclude from Growth Chart --    No data found.  Updated Vital Signs BP 116/74 (BP Location: Left Arm)   Pulse 80    Temp 98.4 F (36.9 C) (Oral)   Resp 16   LMP 01/06/2015   SpO2 97%   Visual Acuity Right Eye Distance:   Left Eye Distance:   Bilateral Distance:    Right Eye Near:   Left Eye Near:    Bilateral Near:     Physical Exam Vitals and nursing note reviewed.  Constitutional:      General: She is not in acute distress.    Appearance: She is well-developed.  HENT:     Head: Normocephalic and atraumatic.     Right Ear: Tympanic membrane normal.     Left Ear: Tympanic membrane normal.  Eyes:     Conjunctiva/sclera: Conjunctivae normal.  Cardiovascular:     Rate and Rhythm: Normal rate and regular rhythm.     Heart sounds: No murmur heard. Pulmonary:     Effort: Pulmonary effort is normal. No respiratory distress.     Breath sounds: Normal breath sounds.  Abdominal:     Palpations: Abdomen is soft.     Tenderness: There is no abdominal tenderness.  Musculoskeletal:        General: No swelling.     Cervical back: Neck supple.  Skin:    General: Skin is warm and dry.     Capillary Refill: Capillary refill takes less than 2 seconds.  Neurological:     Mental Status: She is alert.  Psychiatric:        Mood and Affect: Mood normal.      UC Treatments / Results  Labs (all labs ordered are listed, but only abnormal results are displayed) Labs Reviewed - No data to display  EKG   Radiology No results found.  Procedures Procedures (including critical care time)  Medications Ordered in UC Medications - No data to display  Initial Impression / Assessment and Plan / UC Course  I have reviewed the triage vital signs and the nursing notes.  Pertinent labs & imaging results that were available during my care of the patient were reviewed by me and considered in my medical decision making (see chart for details).     Nausea   58 year old female with nausea likely secondary to dehydration  after drinking alcoholic beverages the night before.  There is no infectious  symptoms, no vomiting, no abdominal pain, no fevers, no chills, no chest pain no shortness of breath.  She has already been able to eat and drink but with some residual nausea.  We have given Zofran  4 mg to help with the residual nausea.  Patient has requested a note for work which we have given her.  I have recommended that she continue to drink plenty of water and may use electrolytes to help.  She knows to return to urgent care or her PCP if symptoms worsen or fail to improve completely.  Final Clinical Impressions(s) / UC Diagnoses   Final diagnoses:  None   Discharge Instructions   None    ED Prescriptions   None    PDMP not reviewed this encounter.   Kreg Pesa, [place] 05/22/23 1906

## 2023-05-27 ENCOUNTER — Other Ambulatory Visit: Payer: Self-pay

## 2023-05-27 ENCOUNTER — Ambulatory Visit (INDEPENDENT_AMBULATORY_CARE_PROVIDER_SITE_OTHER)

## 2023-05-27 ENCOUNTER — Ambulatory Visit (HOSPITAL_COMMUNITY): Admission: EM | Admit: 2023-05-27 | Discharge: 2023-05-27 | Disposition: A | Discharge status: Home / Self Care

## 2023-05-27 DIAGNOSIS — M7918 Myalgia, other site: Secondary | ICD-10-CM

## 2023-05-27 MED ORDER — BACLOFEN 10 MG PO TABS
10.0000 mg | ORAL_TABLET | Freq: Three times a day (TID) | ORAL | 0 refills | Status: DC
Start: 2023-05-27 — End: 2023-07-10

## 2023-05-27 MED ORDER — NAPROXEN 500 MG PO TABS: 500.0000 mg | ORAL_TABLET | Freq: Two times a day (BID) | ORAL | 0 refills | Status: DC

## 2023-05-27 NOTE — ED Triage Notes (Addendum)
 PT reports Rt inner thigh pain and Rt knee pain. Pt fell down stairs 2 weeks ago. PT has 3 meds that were sent to pharmacy and Pt has not picked up meds for pain.

## 2023-05-27 NOTE — ED Provider Notes (Signed)
 [facility] [place]  Note:  This document was prepared using Dragon voice recognition software and may include unintentional dictation errors.  MRN: [id number] DOB: Aug 11, 1965  Subjective:   Lindsey Simpson is a 58 y.o. female presenting for right knee pain and right upper leg pain x 2 to 3 days.  Patient reports that she fell on stairs 2 weeks ago but does not think that fall is related to current pain.  Patient reports history of multijoint arthritis.  Patient was seen here and prescribed baclofen , naproxen , Lidoderm  patches for other muscle pain previously but never picked up medication to treat symptoms.  Patient denies any other trauma or injury to the knee or upper leg.  No current facility-administered medications for this encounter.  Current Outpatient Medications:    baclofen  (LIORESAL ) 10 MG tablet, Take 1 tablet (10 mg total) by mouth 3 (three) times daily., Disp: 30 each, Rfl: 0   naproxen  (NAPROSYN ) 500 MG tablet, Take 1 tablet (500 mg total) by mouth 2 (two) times daily., Disp: 30 tablet, Rfl: 0   olopatadine  (PATADAY ) 0.1 % ophthalmic solution, Place 1 drop into both eyes 2 (two) times daily., Disp: 5 mL, Rfl: 0   ondansetron  (ZOFRAN -ODT) 4 MG disintegrating tablet, Take 1 tablet (4 mg total) by mouth every 8 (eight) hours as needed for nausea or vomiting., Disp: 10 tablet, Rfl: 0   No Known Allergies  Past Medical History:  Diagnosis Date   Anemia    GERD (gastroesophageal reflux disease)      Past Surgical History:  Procedure Laterality Date   TUBAL LIGATION      Family History  Problem Relation Age of Onset   Cancer Mother    Cancer Father    Diabetes Father     Social History   Tobacco Use   Smoking status: Never   Smokeless tobacco: Never  Vaping Use   Vaping status: Never Used  Substance Use Topics   Alcohol use: Yes    Comment: occassional   Drug use: No    ROS Refer to HPI for ROS details.  Objective:   Vitals: BP 122/66    Pulse 66   Temp 97.8 F (36.6 C)   Resp (!) 22   LMP 01/06/2015   SpO2 97%   Physical Exam Vitals and nursing note reviewed.  Constitutional:      General: She is not in acute distress.    Appearance: She is well-developed. She is not ill-appearing or toxic-appearing.  HENT:     Head: Normocephalic and atraumatic.  Cardiovascular:     Rate and Rhythm: Normal rate.  Pulmonary:     Effort: Pulmonary effort is normal. No respiratory distress.  Musculoskeletal:     Right upper leg: Tenderness present. No swelling, deformity or bony tenderness.     Right knee: No swelling, deformity, erythema or bony tenderness. Normal range of motion. Tenderness present. Normal pulse.  Skin:    General: Skin is warm and dry.  Neurological:     General: No focal deficit present.     Mental Status: She is alert and oriented to person, place, and time.  Psychiatric:        Mood and Affect: Mood normal.        Behavior: Behavior normal.     Procedures  No results found for this or any previous visit (from the past 24 hours).  No results found.   Assessment and Plan :     Discharge Instructions  1. Musculoskeletal pain (Primary) - DG Knee Complete 4 Views Right x-ray shows no acute fracture or dislocation, no musculoskeletal abnormality noted. - baclofen  (LIORESAL ) 10 MG tablet; Take 1 tablet (10 mg total) by mouth 3 (three) times daily.  Dispense: 30 each; Refill: 0 - naproxen  (NAPROSYN ) 500 MG tablet; Take 1 tablet (500 mg total) by mouth 2 (two) times daily.  Dispense: 30 tablet; Refill: 0 -Continue to monitor symptoms for any change in severity if there is any escalation of current symptoms or development of new symptoms follow-up in ER for further evaluation and management.    Rykker Coviello B Arisha Gervais   Sharonda Llamas, [place], [place] 05/27/23 [phone]

## 2023-05-27 NOTE — ED Triage Notes (Signed)
 PT DOB and Full name confirmed

## 2023-05-27 NOTE — Discharge Instructions (Signed)
  1. Musculoskeletal pain (Primary) - DG Knee Complete 4 Views Right x-ray shows no acute fracture or dislocation, no musculoskeletal abnormality noted. - baclofen  (LIORESAL ) 10 MG tablet; Take 1 tablet (10 mg total) by mouth 3 (three) times daily.  Dispense: 30 each; Refill: 0 - naproxen  (NAPROSYN ) 500 MG tablet; Take 1 tablet (500 mg total) by mouth 2 (two) times daily.  Dispense: 30 tablet; Refill: 0 -Continue to monitor symptoms for any change in severity if there is any escalation of current symptoms or development of new symptoms follow-up in ER for further evaluation and management.

## 2023-06-05 ENCOUNTER — Other Ambulatory Visit: Payer: Self-pay

## 2023-06-05 ENCOUNTER — Encounter (HOSPITAL_COMMUNITY): Payer: Self-pay | Admitting: *Deleted

## 2023-06-05 ENCOUNTER — Ambulatory Visit (HOSPITAL_COMMUNITY)
Admission: EM | Admit: 2023-06-05 | Discharge: 2023-06-05 | Disposition: A | Discharge status: Home / Self Care | Attending: Emergency Medicine | Admitting: Emergency Medicine

## 2023-06-05 DIAGNOSIS — H9203 Otalgia, bilateral: Secondary | ICD-10-CM

## 2023-06-05 DIAGNOSIS — H6123 Impacted cerumen, bilateral: Secondary | ICD-10-CM

## 2023-06-05 MED ORDER — CARBAMIDE PEROXIDE 6.5 % OT SOLN: 5.0000 [drp] | Freq: Two times a day (BID) | OTIC | 0 refills | Status: DC

## 2023-06-05 NOTE — ED Triage Notes (Signed)
 C/o bilat ear pain onset 2 days. Denies fevers. Denies any nasal congestion.

## 2023-06-05 NOTE — Discharge Instructions (Signed)
 Use the over-the-counter wax softening drops twice daily to help loosen up earwax.  You can return to clinic in the next week or so and we can reattempt irrigation to further clear all of your wax.  Follow-up with your primary care provider if your symptoms persist.

## 2023-06-05 NOTE — ED Provider Notes (Signed)
 [facility]    CSN: [id number] Arrival date & time: 06/05/23  1730      History   Chief Complaint Chief Complaint  Patient presents with   Otalgia    HPI Lindsey Simpson is a 58 y.o. female.   Patient presents to clinic over concerns of bilateral ear pain and fullness for the past two days.  Has not had any drainage from the ears or fever.  She usually puts peroxide in her ears but she has not done this recently.  Did not start the wax softening drops as recommended.  Reports it feels like she has bilateral wax impaction, this is routine for her.  The history is provided by the patient.  Otalgia   Past Medical History:  Diagnosis Date   Anemia    GERD (gastroesophageal reflux disease)     Patient Active Problem List   Diagnosis Date Noted   Musculoskeletal pain 08/12/2022   Acute right-sided back pain 08/12/2022   Bite, insect 10/15/2021   Fibroids, submucosal 02/05/2014   Symptomatic anemia 12/22/2013   Vaginal bleeding 12/22/2013   Hypotension 12/22/2013   UTI (urinary tract infection) 12/22/2013    Past Surgical History:  Procedure Laterality Date   TUBAL LIGATION      OB History     Gravida  6   Para  5   Term  5   Preterm  0   AB  1   Living  5      SAB  1   IAB  0   Ectopic  0   Multiple  0   Live Births               Home Medications    Prior to Admission medications   Medication Sig Start Date End Date Taking? Authorizing Provider  baclofen  (LIORESAL ) 10 MG tablet Take 1 tablet (10 mg total) by mouth 3 (three) times daily. 05/27/23  Yes Reddick, Johnathan B, NP  carbamide peroxide (DEBROX) 6.5 % OTIC solution Place 5 drops into both ears 2 (two) times daily. 06/05/23  Yes Melanye Hiraldo  N, FNP  naproxen  (NAPROSYN ) 500 MG tablet Take 1 tablet (500 mg total) by mouth 2 (two) times daily. 05/27/23  Yes Reddick, Johnathan B, NP  olopatadine  (PATADAY ) 0.1 % ophthalmic solution Place 1 drop into both eyes 2 (two)  times daily. 04/29/23   Levora Reas A, NP  ondansetron  (ZOFRAN -ODT) 4 MG disintegrating tablet Take 1 tablet (4 mg total) by mouth every 8 (eight) hours as needed for nausea or vomiting. 04/17/23   Ellsworth Haas, Paige Boatman, MD    Family History Family History  Problem Relation Age of Onset   Cancer Mother    Cancer Father    Diabetes Father     Social History Social History   Tobacco Use   Smoking status: Never   Smokeless tobacco: Never  Vaping Use   Vaping status: Never Used  Substance Use Topics   Alcohol use: Yes    Comment: occasionally   Drug use: No     Allergies   Patient has no known allergies.   Review of Systems Review of Systems  Per HPI  Physical Exam Triage Vital Signs ED Triage Vitals  Encounter Vitals Group     BP 06/05/23 1851 107/72     Systolic BP Percentile --      Diastolic BP Percentile --      Pulse Rate 06/05/23 1851 67     Resp 06/05/23  1851 18     Temp 06/05/23 1851 97.7 F (36.5 C)     Temp Source 06/05/23 1851 Oral     SpO2 06/05/23 1851 94 %     Weight --      Height --      Head Circumference --      Peak Flow --      Pain Score 06/05/23 1855 6     Pain Loc --      Pain Education --      Exclude from Growth Chart --    No data found.  Updated Vital Signs BP 107/72   Pulse 67   Temp 97.7 F (36.5 C) (Oral)   Resp 18   LMP 01/06/2015   SpO2 94%   Visual Acuity Right Eye Distance:   Left Eye Distance:   Bilateral Distance:    Right Eye Near:   Left Eye Near:    Bilateral Near:     Physical Exam Vitals and nursing note reviewed.  Constitutional:      Appearance: Normal appearance.  HENT:     Head: Normocephalic and atraumatic.     Right Ear: There is impacted cerumen.     Left Ear: There is impacted cerumen.     Nose: Nose normal.     Mouth/Throat:     Mouth: Mucous membranes are moist.  Eyes:     Conjunctiva/sclera: Conjunctivae normal.  Cardiovascular:     Rate and Rhythm: Normal rate.  Pulmonary:      Effort: Pulmonary effort is normal. No respiratory distress.  Skin:    General: Skin is warm and dry.  Neurological:     General: No focal deficit present.     Mental Status: She is alert.  Psychiatric:        Mood and Affect: Mood normal.      UC Treatments / Results  Labs (all labs ordered are listed, but only abnormal results are displayed) Labs Reviewed - No data to display  EKG   Radiology No results found.  Procedures Procedures (including critical care time)  Medications Ordered in UC Medications - No data to display  Initial Impression / Assessment and Plan / UC Course  I have reviewed the triage vital signs and the nursing notes.  Pertinent labs & imaging results that were available during my care of the patient were reviewed by me and considered in my medical decision making (see chart for details).  Vitals in triage reviewed, patient is hemodynamically stable.  Bilateral cerumen impaction.  Ear irrigation performed by RN.  Patient tenderness with ear irrigation, moderate wax removal, some wax remains.  External auditory canal does not appear to be infected.  Advised over-the-counter Debrox and returning to clinic within the next week for repeat irrigation for complete wax removal.  Low concern for acute infection at this time.  Plan of care, follow-up care return precautions given, no questions at this time.  Work note provided.     Final Clinical Impressions(s) / UC Diagnoses   Final diagnoses:  Otalgia of both ears  Bilateral impacted cerumen     Discharge Instructions      Use the over-the-counter wax softening drops twice daily to help loosen up earwax.  You can return to clinic in the next week or so and we can reattempt irrigation to further clear all of your wax.  Follow-up with your primary care provider if your symptoms persist.   ED Prescriptions     Medication  Sig Dispense Auth. Provider   carbamide peroxide (DEBROX) 6.5 % OTIC solution  Place 5 drops into both ears 2 (two) times daily. 15 mL Harlow Lighter, Tahjanae Blankenburg  N, FNP      PDMP not reviewed this encounter.   Harlow Lighter, Shakerria Parran  N, FNP 06/05/23 1935

## 2023-06-11 ENCOUNTER — Encounter (HOSPITAL_COMMUNITY): Payer: Self-pay

## 2023-06-11 ENCOUNTER — Ambulatory Visit (HOSPITAL_COMMUNITY): Admission: EM | Admit: 2023-06-11 | Discharge: 2023-06-11 | Disposition: A | Discharge status: Home / Self Care

## 2023-06-11 DIAGNOSIS — H6123 Impacted cerumen, bilateral: Secondary | ICD-10-CM | POA: Diagnosis not present

## 2023-06-11 NOTE — ED Triage Notes (Signed)
 Patient here today for follow up for ear pain. Patient states that she has been using debrox and she is no longer having pain.

## 2023-06-11 NOTE — Discharge Instructions (Addendum)
  1. Bilateral impacted cerumen (Primary) - Reevaluation of cerumen impaction reveals moderate accumulation of cerumen in bilateral ears.  Patient has no difficulty hearing or ear pain, no purulent drainage from ears. - Patient advised to continue using Debrox drops to continue to soften earwax. - Flush ears and shower with warm water to remove cerumen - If any concern for secondary infection or loss of hearing return to urgent care for evaluation and cerumen removal.

## 2023-06-11 NOTE — ED Provider Notes (Signed)
 [facility]   Note:  This document was prepared using Dragon voice recognition software and may include unintentional dictation errors.  MRN: [id number] DOB: 1965-10-19  Subjective:   Lindsey Simpson is a 58 y.o. female presenting for reevaluation of ears after previous cerumen impaction.  Patient reports she has been using Debrox drops and has no pain, purulent drainage, loss of hearing.  Patient was just like to have her ears evaluated to make sure that there is no secondary problems.  Patient denies any other medical concerns  No current facility-administered medications for this encounter.  Current Outpatient Medications:    baclofen  (LIORESAL ) 10 MG tablet, Take 1 tablet (10 mg total) by mouth 3 (three) times daily., Disp: 30 each, Rfl: 0   carbamide peroxide (DEBROX) 6.5 % OTIC solution, Place 5 drops into both ears 2 (two) times daily., Disp: 15 mL, Rfl: 0   naproxen  (NAPROSYN ) 500 MG tablet, Take 1 tablet (500 mg total) by mouth 2 (two) times daily., Disp: 30 tablet, Rfl: 0   olopatadine  (PATADAY ) 0.1 % ophthalmic solution, Place 1 drop into both eyes 2 (two) times daily., Disp: 5 mL, Rfl: 0   ondansetron  (ZOFRAN -ODT) 4 MG disintegrating tablet, Take 1 tablet (4 mg total) by mouth every 8 (eight) hours as needed for nausea or vomiting., Disp: 10 tablet, Rfl: 0   No Known Allergies  Past Medical History:  Diagnosis Date   Anemia    GERD (gastroesophageal reflux disease)      Past Surgical History:  Procedure Laterality Date   TUBAL LIGATION      Family History  Problem Relation Age of Onset   Cancer Mother    Cancer Father    Diabetes Father     Social History   Tobacco Use   Smoking status: Never   Smokeless tobacco: Never  Vaping Use   Vaping status: Never Used  Substance Use Topics   Alcohol use: Yes    Comment: occasionally   Drug use: No    ROS Refer to HPI for ROS details.  Objective:   Vitals: BP (!) 150/83 (BP Location: Left  Arm)   Pulse 65   Temp 98.1 F (36.7 C) (Oral)   Resp 16   LMP 01/06/2015   SpO2 97%   Physical Exam Vitals and nursing note reviewed.  Constitutional:      General: She is not in acute distress.    Appearance: She is well-developed. She is not ill-appearing or toxic-appearing.  HENT:     Head: Normocephalic and atraumatic.     Right Ear: Tympanic membrane, ear canal and external ear normal. There is impacted cerumen.     Left Ear: Tympanic membrane, ear canal and external ear normal. There is impacted cerumen.     Nose: Nose normal.     Mouth/Throat:     Mouth: Mucous membranes are moist.     Pharynx: Oropharynx is clear.  Eyes:     Extraocular Movements: Extraocular movements intact.     Conjunctiva/sclera: Conjunctivae normal.  Cardiovascular:     Rate and Rhythm: Normal rate.  Pulmonary:     Effort: Pulmonary effort is normal. No respiratory distress.  Skin:    General: Skin is warm and dry.  Neurological:     General: No focal deficit present.     Mental Status: She is alert and oriented to person, place, and time.  Psychiatric:        Mood and Affect: Mood normal.  Behavior: Behavior normal.     Procedures  No results found for this or any previous visit (from the past 24 hours).  No results found.   Assessment and Plan :     Discharge Instructions       1. Bilateral impacted cerumen (Primary) - Reevaluation of cerumen impaction reveals moderate accumulation of cerumen in bilateral ears.  Patient has no difficulty hearing or ear pain, no purulent drainage from ears. - Patient advised to continue using Debrox drops to continue to soften earwax. - Flush ears and shower with warm water to remove cerumen - If any concern for secondary infection or loss of hearing return to urgent care for evaluation and cerumen removal.     Lindsey Simpson   Lindsey Dunnigan B, NP 06/11/23 1836

## 2023-06-19 ENCOUNTER — Encounter (HOSPITAL_COMMUNITY): Payer: Self-pay | Admitting: *Deleted

## 2023-06-19 ENCOUNTER — Ambulatory Visit (HOSPITAL_COMMUNITY)
Admission: EM | Admit: 2023-06-19 | Discharge: 2023-06-19 | Disposition: A | Discharge status: Home / Self Care | Attending: Internal Medicine | Admitting: Internal Medicine

## 2023-06-19 ENCOUNTER — Other Ambulatory Visit: Payer: Self-pay

## 2023-06-19 DIAGNOSIS — H1013 Acute atopic conjunctivitis, bilateral: Secondary | ICD-10-CM

## 2023-06-19 MED ORDER — OLOPATADINE HCL 0.1 % OP SOLN
1.0000 [drp] | Freq: Two times a day (BID) | OPHTHALMIC | 0 refills | Status: DC
Start: 2023-06-19 — End: 2023-08-02

## 2023-06-19 MED ORDER — LORATADINE 10 MG PO TABS
10.0000 mg | ORAL_TABLET | Freq: Every day | ORAL | 1 refills | Status: DC
Start: 2023-06-19 — End: 2023-08-02

## 2023-06-19 NOTE — ED Provider Notes (Signed)
 [facility]    CSN: [id number] Arrival date & time: 06/19/23  1719      History   Chief Complaint Chief Complaint  Patient presents with   Eye Drainage    HPI Lindsey Simpson is a 58 y.o. female.   58 year old female who presents urgent care with complaints of bilateral eye irritation and watering.  She reports that this been going on for several months.  She has not tried any over-the-counter medication.  She was seen for this about 2 months ago and was advised to use an eye drop but she did not get this.  She would like to have this eyedrop now.  She denies any fevers, pain, chills, purulent drainage.  She denies any new make-up or exposures.  She does have issues with allergies and feels that it may be her allergies causing her eye irritation.  She is not having any visual changes. There is no photophobia.      Past Medical History:  Diagnosis Date   Anemia    GERD (gastroesophageal reflux disease)     Patient Active Problem List   Diagnosis Date Noted   Musculoskeletal pain 08/12/2022   Acute right-sided back pain 08/12/2022   Bite, insect 10/15/2021   Fibroids, submucosal 02/05/2014   Symptomatic anemia 12/22/2013   Vaginal bleeding 12/22/2013   Hypotension 12/22/2013   UTI (urinary tract infection) 12/22/2013    Past Surgical History:  Procedure Laterality Date   TUBAL LIGATION      OB History     Gravida  6   Para  5   Term  5   Preterm  0   AB  1   Living  5      SAB  1   IAB  0   Ectopic  0   Multiple  0   Live Births               Home Medications    Prior to Admission medications   Medication Sig Start Date End Date Taking? Authorizing Provider  baclofen  (LIORESAL ) 10 MG tablet Take 1 tablet (10 mg total) by mouth 3 (three) times daily. 05/27/23  Yes Reddick, Johnathan B, NP  carbamide peroxide (DEBROX) 6.5 % OTIC solution Place 5 drops into both ears 2 (two) times daily. 06/05/23  Yes Garrison, [place]  N, FNP   loratadine (CLARITIN) 10 MG tablet Take 1 tablet (10 mg total) by mouth daily. 06/19/23  Yes Jathniel Smeltzer A, PA-C  naproxen  (NAPROSYN ) 500 MG tablet Take 1 tablet (500 mg total) by mouth 2 (two) times daily. 05/27/23  Yes Reddick, Johnathan B, NP  olopatadine  (PATADAY ) 0.1 % ophthalmic solution Place 1 drop into both eyes 2 (two) times daily. 06/19/23   Yannis Broce A, PA-C  ondansetron  (ZOFRAN -ODT) 4 MG disintegrating tablet Take 1 tablet (4 mg total) by mouth every 8 (eight) hours as needed for nausea or vomiting. 04/17/23   Ellsworth Haas, Paige Boatman, MD    Family History Family History  Problem Relation Age of Onset   Cancer Mother    Cancer Father    Diabetes Father     Social History Social History   Tobacco Use   Smoking status: Never   Smokeless tobacco: Never  Vaping Use   Vaping status: Never Used  Substance Use Topics   Alcohol use: Yes    Comment: occasionally   Drug use: No     Allergies   Patient has no known allergies.   Review  of Systems Review of Systems  Constitutional:  Negative for chills and fever.  HENT:  Negative for ear pain and sore throat.   Eyes:  Positive for discharge (watering) and itching. Negative for pain and visual disturbance.  Respiratory:  Negative for cough and shortness of breath.   Cardiovascular:  Negative for chest pain and palpitations.  Gastrointestinal:  Negative for abdominal pain and vomiting.  Genitourinary:  Negative for dysuria and hematuria.  Musculoskeletal:  Negative for arthralgias and back pain.  Skin:  Negative for color change and rash.  Neurological:  Negative for seizures and syncope.  All other systems reviewed and are negative.    Physical Exam Triage Vital Signs ED Triage Vitals  Encounter Vitals Group     BP 06/19/23 1925 123/82     Systolic BP Percentile --      Diastolic BP Percentile --      Pulse Rate 06/19/23 1925 69     Resp 06/19/23 1925 18     Temp 06/19/23 1925 97.6 F (36.4 C)     Temp src  --      SpO2 06/19/23 1925 96 %     Weight --      Height --      Head Circumference --      Peak Flow --      Pain Score 06/19/23 1923 0     Pain Loc --      Pain Education --      Exclude from Growth Chart --    No data found.  Updated Vital Signs BP 123/82   Pulse 69   Temp 97.6 F (36.4 C)   Resp 18   LMP 01/06/2015   SpO2 96%   Visual Acuity Right Eye Distance:   Left Eye Distance:   Bilateral Distance:    Right Eye Near:   Left Eye Near:    Bilateral Near:     Physical Exam Vitals and nursing note reviewed.  Constitutional:      General: She is not in acute distress.    Appearance: She is well-developed.  HENT:     Head: Normocephalic and atraumatic.  Eyes:     Conjunctiva/sclera: Conjunctivae normal.  Cardiovascular:     Rate and Rhythm: Normal rate and regular rhythm.     Heart sounds: No murmur heard. Pulmonary:     Effort: Pulmonary effort is normal. No respiratory distress.     Breath sounds: Normal breath sounds.  Abdominal:     Palpations: Abdomen is soft.     Tenderness: There is no abdominal tenderness.  Musculoskeletal:        General: No swelling.     Cervical back: Neck supple.  Skin:    General: Skin is warm and dry.     Capillary Refill: Capillary refill takes less than 2 seconds.  Neurological:     Mental Status: She is alert.  Psychiatric:        Mood and Affect: Mood normal.      UC Treatments / Results  Labs (all labs ordered are listed, but only abnormal results are displayed) Labs Reviewed - No data to display  EKG   Radiology No results found.  Procedures Procedures (including critical care time)  Medications Ordered in UC Medications - No data to display  Initial Impression / Assessment and Plan / UC Course  I have reviewed the triage vital signs and the nursing notes.  Pertinent labs & imaging results that were available during  my care of the patient were reviewed by me and considered in my medical  decision making (see chart for details).     Allergic conjunctivitis of both eyes   Symptoms, history and physical exam findings are most consistent with an allergic conjunctivitis.  This does not require antibiotic treatment. This can be treated with medication for allergies in both eye drops and pill form. We will treat with the following:  Olopatadine  (pataday ) eyedrops, 1 drop in both eyes 2 times daily.  May want to try this for 2 to 3 weeks. Claritin 10 mg by mouth once daily.  This medication can help with allergic conjunctivitis. Could consider changing make up as this can sometimes cause allergic reactions.  Return to urgent care or PCP if symptoms worsen or fail to resolve.    Final Clinical Impressions(s) / UC Diagnoses   Final diagnoses:  Allergic conjunctivitis of both eyes     Discharge Instructions      Symptoms, history and physical exam findings are most consistent with an allergic conjunctivitis.  This does not require antibiotic treatment. This can be treated with medication for allergies in both eye drops and pill form. We will treat with the following:  Olopatadine  (pataday ) eyedrops, 1 drop in both eyes 2 times daily.  May want to try this for 2 to 3 weeks. Claritin 10 mg by mouth once daily.  This medication can help with allergic conjunctivitis. Could consider changing make up as this can sometimes cause allergic reactions.  Return to urgent care or PCP if symptoms worsen or fail to resolve.    ED Prescriptions     Medication Sig Dispense Auth. Provider   olopatadine  (PATADAY ) 0.1 % ophthalmic solution Place 1 drop into both eyes 2 (two) times daily. 5 mL Vrinda Heckstall A, PA-C   loratadine (CLARITIN) 10 MG tablet Take 1 tablet (10 mg total) by mouth daily. 30 tablet Kreg Pesa, [place]      PDMP not reviewed this encounter.   Acie Acosta 06/19/23 2009

## 2023-06-19 NOTE — ED Triage Notes (Signed)
 PT reports ey eallergy fo rmonths. Pt has itching to eyes and drainage.

## 2023-06-19 NOTE — Discharge Instructions (Addendum)
 Symptoms, history and physical exam findings are most consistent with an allergic conjunctivitis.  This does not require antibiotic treatment. This can be treated with medication for allergies in both eye drops and pill form. We will treat with the following:  Olopatadine  (pataday ) eyedrops, 1 drop in both eyes 2 times daily.  May want to try this for 2 to 3 weeks. Claritin 10 mg by mouth once daily.  This medication can help with allergic conjunctivitis. Could consider changing make up as this can sometimes cause allergic reactions.  Return to urgent care or PCP if symptoms worsen or fail to resolve.

## 2023-07-10 ENCOUNTER — Ambulatory Visit (HOSPITAL_COMMUNITY)
Admission: EM | Admit: 2023-07-10 | Discharge: 2023-07-10 | Disposition: A | Discharge status: Home / Self Care | Attending: Emergency Medicine | Admitting: Emergency Medicine

## 2023-07-10 ENCOUNTER — Encounter (HOSPITAL_COMMUNITY): Payer: Self-pay | Admitting: Emergency Medicine

## 2023-07-10 ENCOUNTER — Other Ambulatory Visit: Payer: Self-pay

## 2023-07-10 DIAGNOSIS — M1990 Unspecified osteoarthritis, unspecified site: Secondary | ICD-10-CM | POA: Diagnosis not present

## 2023-07-10 DIAGNOSIS — Z760 Encounter for issue of repeat prescription: Secondary | ICD-10-CM

## 2023-07-10 MED ORDER — NAPROXEN 375 MG PO TABS
ORAL_TABLET | ORAL | 0 refills | Status: DC
Start: 2023-07-10 — End: 2023-07-24

## 2023-07-10 NOTE — ED Provider Notes (Signed)
 [facility]    CSN: [id number] Arrival date & time: 07/10/23  1734      History   Chief Complaint Chief Complaint  Patient presents with   Medication Refill    HPI Lindsey Simpson is a 58 y.o. female.  Patient requesting naproxen  prescription She reports history of arthritis Was seen here 5/1 and 5/10 and given naproxen  This medication works for her and she would like to continue She does not have a primary care provider   She is not currently having any pain  Past Medical History:  Diagnosis Date   Anemia    GERD (gastroesophageal reflux disease)     Patient Active Problem List   Diagnosis Date Noted   Musculoskeletal pain 08/12/2022   Acute right-sided back pain 08/12/2022   Bite, insect 10/15/2021   Fibroids, submucosal 02/05/2014   Symptomatic anemia 12/22/2013   Vaginal bleeding 12/22/2013   Hypotension 12/22/2013   UTI (urinary tract infection) 12/22/2013    Past Surgical History:  Procedure Laterality Date   TUBAL LIGATION      OB History     Gravida  6   Para  5   Term  5   Preterm  0   AB  1   Living  5      SAB  1   IAB  0   Ectopic  0   Multiple  0   Live Births               Home Medications    Prior to Admission medications   Medication Sig Start Date End Date Taking? Authorizing Provider  naproxen  (NAPROSYN ) 375 MG tablet Take 1 tablet once or twice daily as needed for pain. 07/10/23  Yes Quanta Robertshaw, Asberry, PA-C  loratadine  (CLARITIN ) 10 MG tablet Take 1 tablet (10 mg total) by mouth daily. 06/19/23   White, Elizabeth A, PA-C  olopatadine  (PATADAY ) 0.1 % ophthalmic solution Place 1 drop into both eyes 2 (two) times daily. 06/19/23   Teresa Almarie LABOR, PA-C    Family History Family History  Problem Relation Age of Onset   Cancer Mother    Cancer Father    Diabetes Father     Social History Social History   Tobacco Use   Smoking status: Never   Smokeless tobacco: Never  Vaping Use   Vaping  status: Never Used  Substance Use Topics   Alcohol use: Yes    Comment: occasionally   Drug use: No     Allergies   Patient has no known allergies.   Review of Systems Review of Systems As per HPI  Physical Exam Triage Vital Signs ED Triage Vitals  Encounter Vitals Group     BP 07/10/23 1903 108/72     Girls Systolic BP Percentile --      Girls Diastolic BP Percentile --      Boys Systolic BP Percentile --      Boys Diastolic BP Percentile --      Pulse Rate 07/10/23 1903 77     Resp 07/10/23 1903 18     Temp 07/10/23 1903 (!) 97.5 F (36.4 C)     Temp Source 07/10/23 1903 Oral     SpO2 07/10/23 1903 97 %     Weight --      Height --      Head Circumference --      Peak Flow --      Pain Score 07/10/23 1901 6  Pain Loc --      Pain Education --      Exclude from Growth Chart --    No data found.  Updated Vital Signs BP 108/72 (BP Location: Left Arm)   Pulse 77   Temp (!) 97.5 F (36.4 C) (Oral)   Resp 18   LMP 01/06/2015   SpO2 97%    Physical Exam Vitals and nursing note reviewed.  Constitutional:      General: She is not in acute distress.    Appearance: Normal appearance.  HENT:     Mouth/Throat:     Pharynx: Oropharynx is clear.   Cardiovascular:     Rate and Rhythm: Normal rate and regular rhythm.     Pulses: Normal pulses.     Heart sounds: Normal heart sounds.  Pulmonary:     Effort: Pulmonary effort is normal.     Breath sounds: Normal breath sounds.   Neurological:     Mental Status: She is alert and oriented to person, place, and time.     UC Treatments / Results  Labs (all labs ordered are listed, but only abnormal results are displayed) Labs Reviewed - No data to display  EKG   Radiology No results found.  Procedures Procedures (including critical care time)  Medications Ordered in UC Medications - No data to display  Initial Impression / Assessment and Plan / UC Course  I have reviewed the triage vital signs  and the nursing notes.  Pertinent labs & imaging results that were available during my care of the patient were reviewed by me and considered in my medical decision making (see chart for details).  Have sent naproxen  to pharmacy, 375 mg once or twice daily as needed for pain. I have set patient up with a primary care provider for 2 months from now on September 2nd. She will follow with them regarding arthritis and other routine health maintenance. Can return here if needed.   A note for work is requested and provided  Final Clinical Impressions(s) / UC Diagnoses   Final diagnoses:  Arthritis  Medication refill     Discharge Instructions      Naproxen  one tablet nightly as needed for pain  Your new primary care appointment details are attached!      ED Prescriptions     Medication Sig Dispense Auth. Provider   naproxen  (NAPROSYN ) 375 MG tablet Take 1 tablet once or twice daily as needed for pain. 30 tablet Chonte Ricke, Asberry, PA-C      PDMP not reviewed this encounter.   Jeryl Asberry RIGGERS 07/10/23 [id number]

## 2023-07-10 NOTE — ED Triage Notes (Signed)
 Patient is requesting a naproxen  refill for arthritis pain.  Patient does not have a pcp

## 2023-07-10 NOTE — Discharge Instructions (Signed)
 Naproxen  one tablet nightly as needed for pain  Your new primary care appointment details are attached!

## 2023-07-20 ENCOUNTER — Ambulatory Visit (HOSPITAL_COMMUNITY)
Admission: EM | Admit: 2023-07-20 | Discharge: 2023-07-20 | Disposition: A | Discharge status: Home / Self Care | Attending: Family Medicine | Admitting: Family Medicine

## 2023-07-20 ENCOUNTER — Encounter (HOSPITAL_COMMUNITY): Payer: Self-pay | Admitting: *Deleted

## 2023-07-20 DIAGNOSIS — R110 Nausea: Secondary | ICD-10-CM | POA: Diagnosis not present

## 2023-07-20 LAB — POCT URINALYSIS DIP (MANUAL ENTRY)
Glucose, UA: NEGATIVE mg/dL
Ketones, POC UA: NEGATIVE mg/dL
Leukocytes, UA: NEGATIVE
Nitrite, UA: NEGATIVE
Protein Ur, POC: NEGATIVE mg/dL
Spec Grav, UA: >=1.03 — AB (ref 1.010–1.025)
Urobilinogen, UA: 0.2 U/dL
pH, UA: 5.5 (ref 5.0–8.0)

## 2023-07-20 MED ORDER — ONDANSETRON 4 MG PO TBDP
ORAL_TABLET | ORAL | Status: AC
  Filled 2023-07-20: qty 1

## 2023-07-20 MED ORDER — ONDANSETRON 4 MG PO TBDP
4.0000 mg | ORAL_TABLET | Freq: Once | ORAL | Status: AC
  Administered 2023-07-20: 4 mg via ORAL

## 2023-07-20 MED ORDER — ONDANSETRON 4 MG PO TBDP: 4.0000 mg | ORAL_TABLET | Freq: Three times a day (TID) | ORAL | 0 refills | Status: DC | PRN

## 2023-07-20 NOTE — Discharge Instructions (Signed)
Please do your best to ensure adequate fluid intake in order to avoid dehydration. If you find that you are unable to tolerate drinking fluids regularly please proceed to the Emergency Department for evaluation. ° ° °

## 2023-07-20 NOTE — ED Provider Notes (Signed)
 [facility]   [id number] 07/20/23 Arrival Time: 1727  ASSESSMENT & PLAN:  1. Nausea    Benign abdomen. Tolerating PO intake. Meds ordered this encounter  Medications   ondansetron  (ZOFRAN -ODT) disintegrating tablet 4 mg   ondansetron  (ZOFRAN -ODT) 4 MG disintegrating tablet    Sig: Take 1 tablet (4 mg total) by mouth every 8 (eight) hours as needed for nausea or vomiting.    Dispense:  15 tablet    Refill:  0   Work note requested and given.  Reviewed expectations re: course of current medical issues. Questions answered. Outlined signs and symptoms indicating need for more acute intervention. Patient verbalized understanding. After Visit Summary given.   SUBJECTIVE: History from: patient.  Lindsey Simpson is a 58 y.o. female who presents with complaint of non-bilious, non-bloody intermittent nausea without emesis; a couple of non-bloody loose bowel movements. Overall feeling better now. Mild nausea here. Denies fever. Denies urinary symptoms.  Patient's last menstrual period was 01/06/2015.  Past Surgical History:  Procedure Laterality Date   TUBAL LIGATION      OBJECTIVE:  Vitals:   07/20/23 1822  BP: 134/83  Pulse: 64  Resp: 16  Temp: 97.7 F (36.5 C)  TempSrc: Oral  SpO2: 96%    General appearance: alert; no distress Oropharynx: moist Lungs: clear to auscultation bilaterally; unlabored Heart: regular Abdomen: soft; non-distended; without TTP/guarding Back: no CVA tenderness Extremities: no edema; symmetrical with no gross deformities Skin: warm; dry Neurologic: normal gait Psychological: alert and cooperative; normal mood and affect  Labs: Results for orders placed or performed during the hospital encounter of 07/20/23  POC urinalysis dipstick   Collection Time: 07/20/23  7:04 PM  Result Value Ref Range   Color, UA straw (A) yellow   Clarity, UA clear clear   Glucose, UA negative negative mg/dL   Bilirubin, UA small (A) negative    Ketones, POC UA negative negative mg/dL   Spec Grav, UA >=8.969 (A) 1.010 - 1.025   Blood, UA trace-intact (A) negative   pH, UA 5.5 5.0 - 8.0   Protein Ur, POC negative negative mg/dL   Urobilinogen, UA 0.2 0.2 or 1.0 E.U./dL   Nitrite, UA Negative Negative   Leukocytes, UA Negative Negative   Labs Reviewed  POCT URINALYSIS DIP (MANUAL ENTRY) - Abnormal; Notable for the following components:      Result Value   Color, UA straw (*)    Bilirubin, UA small (*)    Spec Grav, UA >=1.030 (*)    Blood, UA trace-intact (*)    All other components within normal limits    Imaging: No results found.  No Known Allergies                                             Past Medical History:  Diagnosis Date   Anemia    GERD (gastroesophageal reflux disease)    Social History   Socioeconomic History   Marital status: Single    Spouse name: Not on file   Number of children: Not on file   Years of education: Not on file   Highest education level: Not on file  Occupational History   Not on file  Tobacco Use   Smoking status: Never   Smokeless tobacco: Never  Vaping Use   Vaping status: Never Used  Substance and Sexual Activity   Alcohol  use: Yes    Comment: occasionally   Drug use: No   Sexual activity: Yes  Other Topics Concern   Not on file  Social History Narrative   Not on file   Social Drivers of Health   Financial Resource Strain: Not on file  Food Insecurity: Not on file  Transportation Needs: Not on file  Physical Activity: Not on file  Stress: Not on file  Social Connections: Not on file  Intimate Partner Violence: Not on file   Family History  Problem Relation Age of Onset   Cancer Mother    Cancer Father    Diabetes Father       Rolinda Rogue, MD 07/20/23 1919

## 2023-07-20 NOTE — ED Triage Notes (Addendum)
 Pt states she has had nausea X 2 days. No vomiting or diarrhea. Denies any other sx. She hasn't taken any meds.   Pt eating cheese-its and drinking ginger ale.

## 2023-07-24 ENCOUNTER — Encounter (HOSPITAL_COMMUNITY): Payer: Self-pay

## 2023-07-24 ENCOUNTER — Ambulatory Visit (HOSPITAL_COMMUNITY)
Admission: EM | Admit: 2023-07-24 | Discharge: 2023-07-24 | Disposition: A | Discharge status: Home / Self Care | Attending: Family Medicine | Admitting: Family Medicine

## 2023-07-24 DIAGNOSIS — M549 Dorsalgia, unspecified: Secondary | ICD-10-CM

## 2023-07-24 MED ORDER — KETOROLAC TROMETHAMINE 30 MG/ML IJ SOLN
INTRAMUSCULAR | Status: AC
  Filled 2023-07-24: qty 1

## 2023-07-24 MED ORDER — KETOROLAC TROMETHAMINE 30 MG/ML IJ SOLN
30.0000 mg | Freq: Once | INTRAMUSCULAR | Status: AC
  Administered 2023-07-24: 30 mg via INTRAMUSCULAR

## 2023-07-24 MED ORDER — KETOROLAC TROMETHAMINE 10 MG PO TABS: 10.0000 mg | ORAL_TABLET | Freq: Four times a day (QID) | ORAL | 0 refills | Status: AC | PRN

## 2023-07-24 NOTE — ED Provider Notes (Signed)
 [facility]    CSN: [id number] Arrival date & time: 07/24/23  1628      History   Chief Complaint Chief Complaint  Patient presents with   Back Pain    HPI Lindsey Simpson is a 58 y.o. female.    Back Pain Here for some upper back pain.  It is mainly over both scapula.  It started bothering her about 3 days ago.  No trauma or fall.  No definite overuse  No fever and no cough or congestion.  NKDA  She has not been taking anything for the pain.  She was seen for nausea a few days ago but that is no longer a problem.  Last EGFR was greater than 60 in April of this year.  Past Medical History:  Diagnosis Date   Anemia    GERD (gastroesophageal reflux disease)     Patient Active Problem List   Diagnosis Date Noted   Musculoskeletal pain 08/12/2022   Acute right-sided back pain 08/12/2022   Bite, insect 10/15/2021   Fibroids, submucosal 02/05/2014   Symptomatic anemia 12/22/2013   Vaginal bleeding 12/22/2013   Hypotension 12/22/2013   UTI (urinary tract infection) 12/22/2013    Past Surgical History:  Procedure Laterality Date   TUBAL LIGATION      OB History     Gravida  6   Para  5   Term  5   Preterm  0   AB  1   Living  5      SAB  1   IAB  0   Ectopic  0   Multiple  0   Live Births               Home Medications    Prior to Admission medications   Medication Sig Start Date End Date Taking? Authorizing Provider  ketorolac  (TORADOL ) 10 MG tablet Take 1 tablet (10 mg total) by mouth every 6 (six) hours as needed (pain). 07/24/23  Yes Vonna Sharlet POUR, MD  loratadine  (CLARITIN ) 10 MG tablet Take 1 tablet (10 mg total) by mouth daily. 06/19/23   White, Elizabeth A, PA-C  olopatadine  (PATADAY ) 0.1 % ophthalmic solution Place 1 drop into both eyes 2 (two) times daily. 06/19/23   Teresa Almarie LABOR, PA-C    Family History Family History  Problem Relation Age of Onset   Cancer Mother    Cancer Father    Diabetes  Father     Social History Social History   Tobacco Use   Smoking status: Never   Smokeless tobacco: Never  Vaping Use   Vaping status: Never Used  Substance Use Topics   Alcohol use: Yes    Comment: occasionally   Drug use: No     Allergies   Patient has no known allergies.   Review of Systems Review of Systems  Musculoskeletal:  Positive for back pain.     Physical Exam Triage Vital Signs ED Triage Vitals [07/24/23 1708]  Encounter Vitals Group     BP 109/67     Girls Systolic BP Percentile      Girls Diastolic BP Percentile      Boys Systolic BP Percentile      Boys Diastolic BP Percentile      Pulse Rate 85     Resp 18     Temp 98.3 F (36.8 C)     Temp Source Oral     SpO2 96 %     Weight  Height      Head Circumference      Peak Flow      Pain Score 8     Pain Loc      Pain Education      Exclude from Growth Chart    No data found.  Updated Vital Signs BP 109/67 (BP Location: Right Arm)   Pulse 85   Temp 98.3 F (36.8 C) (Oral)   Resp 18   LMP 01/06/2015   SpO2 96%   Visual Acuity Right Eye Distance:   Left Eye Distance:   Bilateral Distance:    Right Eye Near:   Left Eye Near:    Bilateral Near:     Physical Exam Vitals reviewed.  Constitutional:      General: She is not in acute distress.    Appearance: She is not ill-appearing, toxic-appearing or diaphoretic.  HENT:     Mouth/Throat:     Mouth: Mucous membranes are moist.  Eyes:     Extraocular Movements: Extraocular movements intact.     Conjunctiva/sclera: Conjunctivae normal.     Pupils: Pupils are equal, round, and reactive to light.  Cardiovascular:     Rate and Rhythm: Normal rate and regular rhythm.     Heart sounds: No murmur heard. Pulmonary:     Effort: Pulmonary effort is normal.     Breath sounds: Normal breath sounds.  Musculoskeletal:     Cervical back: Neck supple.  Lymphadenopathy:     Cervical: No cervical adenopathy.  Skin:    Coloration: Skin  is not jaundiced or pale.  Neurological:     General: No focal deficit present.     Mental Status: She is alert and oriented to person, place, and time.  Psychiatric:        Behavior: Behavior normal.      UC Treatments / Results  Labs (all labs ordered are listed, but only abnormal results are displayed) Labs Reviewed - No data to display  EKG   Radiology No results found.  Procedures Procedures (including critical care time)  Medications Ordered in UC Medications  ketorolac  (TORADOL ) 30 MG/ML injection 30 mg (has no administration in time range)    Initial Impression / Assessment and Plan / UC Course  I have reviewed the triage vital signs and the nursing notes.  Pertinent labs & imaging results that were available during my care of the patient were reviewed by me and considered in my medical decision making (see chart for details).   This appears to be musculoskeletal in nature.  Toradol  injections given here and Toradol  tablets were sent to the pharmacy.  Work note is provided Final Clinical Impressions(s) / UC Diagnoses   Final diagnoses:  Upper back pain     Discharge Instructions      You have been given a shot of Toradol  30 mg today.  Ketorolac  10 mg tablets--take 1 tablet every 6 hours as needed for pain.  This is the same medicine that is in the shot we just gave you       ED Prescriptions     Medication Sig Dispense Auth. Provider   ketorolac  (TORADOL ) 10 MG tablet Take 1 tablet (10 mg total) by mouth every 6 (six) hours as needed (pain). 20 tablet Herley Bernardini, Sharlet POUR, MD      I have reviewed the PDMP during this encounter.   Vonna Sharlet POUR, MD 07/24/23 [phone]

## 2023-07-24 NOTE — ED Triage Notes (Signed)
 Pt c/o upper back/bilateral shoulder blade pain x3 days. Denies known injury. Denies taken any meds.

## 2023-07-24 NOTE — Discharge Instructions (Signed)
You have been given a shot of Toradol 30 mg today.  Ketorolac 10 mg tablets--take 1 tablet every 6 hours as needed for pain.  This is the same medicine that is in the shot we just gave you   

## 2023-08-02 ENCOUNTER — Encounter (HOSPITAL_COMMUNITY): Payer: Self-pay | Admitting: *Deleted

## 2023-08-02 ENCOUNTER — Ambulatory Visit (HOSPITAL_COMMUNITY)
Admission: EM | Admit: 2023-08-02 | Discharge: 2023-08-02 | Disposition: A | Discharge status: Home / Self Care | Attending: Physician Assistant | Admitting: Physician Assistant

## 2023-08-02 DIAGNOSIS — L03011 Cellulitis of right finger: Secondary | ICD-10-CM | POA: Diagnosis not present

## 2023-08-02 MED ORDER — AMOXICILLIN-POT CLAVULANATE 875-125 MG PO TABS: 1.0000 | ORAL_TABLET | Freq: Two times a day (BID) | ORAL | 0 refills | Status: AC

## 2023-08-02 MED ORDER — MUPIROCIN 2 % EX OINT: 1.0000 | TOPICAL_OINTMENT | Freq: Two times a day (BID) | CUTANEOUS | 0 refills | Status: AC

## 2023-08-02 NOTE — ED Provider Notes (Signed)
 [facility]    CSN: [id number] Arrival date & time: 08/02/23  1641      History   Chief Complaint Chief Complaint  Patient presents with   Finger Injury    HPI Lindsey Simpson is a 58 y.o. female.   Patient presents today with a injury that occurred approximately 1 week ago to her right middle finger.  Reports that she was taking around and her door box when something sharp poked her fingernail the nailbed.  She cleaned this and initially had no significant problems but then over the past few days it has become red and swollen in that area.  She reports that the pain is rated 7 on a 0-10 pain scale, described as throbbing, no aggravating or alleviating factors identified.  She has not been applying any topical medication or taking any over-the-counter medicines for symptom management.  She denies any  significant trauma or focal bony tenderness.  Denies any fever, nausea, vomiting.  Denies any  recurrent skin infections or recent antibiotics.  She is confident that she is not pregnant.  She is right-hand dominant.    Past Medical History:  Diagnosis Date   Anemia    GERD (gastroesophageal reflux disease)     Patient Active Problem List   Diagnosis Date Noted   Musculoskeletal pain 08/12/2022   Acute right-sided back pain 08/12/2022   Bite, insect 10/15/2021   Fibroids, submucosal 02/05/2014   Symptomatic anemia 12/22/2013   Vaginal bleeding 12/22/2013   Hypotension 12/22/2013   UTI (urinary tract infection) 12/22/2013    Past Surgical History:  Procedure Laterality Date   TUBAL LIGATION      OB History     Gravida  6   Para  5   Term  5   Preterm  0   AB  1   Living  5      SAB  1   IAB  0   Ectopic  0   Multiple  0   Live Births               Home Medications    Prior to Admission medications   Medication Sig Start Date End Date Taking? Authorizing Provider  amoxicillin -clavulanate (AUGMENTIN ) 875-125 MG tablet Take 1  tablet by mouth every 12 (twelve) hours. 08/02/23  Yes Rolf Fells K, PA-C  mupirocin  ointment (BACTROBAN ) 2 % Apply 1 Application topically 2 (two) times daily. 08/02/23  Yes Dellar Traber K, PA-C  ketorolac  (TORADOL ) 10 MG tablet Take 1 tablet (10 mg total) by mouth every 6 (six) hours as needed (pain). 07/24/23   Vonna Sharlet POUR, MD    Family History Family History  Problem Relation Age of Onset   Cancer Mother    Cancer Father    Diabetes Father     Social History Social History   Tobacco Use   Smoking status: Never   Smokeless tobacco: Never  Vaping Use   Vaping status: Never Used  Substance Use Topics   Alcohol use: Yes    Comment: occasionally   Drug use: No     Allergies   Patient has no known allergies.   Review of Systems Review of Systems  Constitutional:  Positive for activity change. Negative for appetite change, fatigue and fever.  Gastrointestinal:  Negative for nausea and vomiting.  Musculoskeletal:  Negative for arthralgias and myalgias.  Skin:  Positive for wound. Negative for color change.  Neurological:  Negative for weakness and numbness.  Physical Exam Triage Vital Signs ED Triage Vitals  Encounter Vitals Group     BP 08/02/23 1748 116/75     Girls Systolic BP Percentile --      Girls Diastolic BP Percentile --      Boys Systolic BP Percentile --      Boys Diastolic BP Percentile --      Pulse Rate 08/02/23 1748 66     Resp 08/02/23 1748 18     Temp 08/02/23 1747 98 F (36.7 C)     Temp Source 08/02/23 1747 Oral     SpO2 08/02/23 1748 98 %     Weight --      Height --      Head Circumference --      Peak Flow --      Pain Score 08/02/23 1748 7     Pain Loc --      Pain Education --      Exclude from Growth Chart --    No data found.  Updated Vital Signs BP 116/75   Pulse 66   Temp 98 F (36.7 C) (Oral)   Resp 18   LMP 01/06/2015   SpO2 98%   Visual Acuity Right Eye Distance:   Left Eye Distance:   Bilateral  Distance:    Right Eye Near:   Left Eye Near:    Bilateral Near:     Physical Exam Vitals reviewed.  Constitutional:      General: She is awake. She is not in acute distress.    Appearance: Normal appearance. She is well-developed. She is not ill-appearing.     Comments: Very pleasant female appears stated age in no acute distress sitting comfortably in exam room  HENT:     Head: Normocephalic and atraumatic.  Cardiovascular:     Rate and Rhythm: Normal rate and regular rhythm.     Heart sounds: Normal heart sounds, S1 normal and S2 normal. No murmur heard.    Comments: Capillary fill within 2 seconds right fingers Pulmonary:     Effort: Pulmonary effort is normal.     Breath sounds: Normal breath sounds. No wheezing, rhonchi or rales.     Comments: Clear to auscultation bilaterally Musculoskeletal:     Right hand: Swelling and tenderness present. No bony tenderness. Normal range of motion. There is no disruption of two-point discrimination. Normal capillary refill.     Comments: Right middle finger: Hand is neurovascularly intact.  Erythema and swelling at proximal and radial lateral nail folds without fluctuance or obvious fluid collection.  Psychiatric:        Behavior: Behavior is cooperative.      UC Treatments / Results  Labs (all labs ordered are listed, but only abnormal results are displayed) Labs Reviewed - No data to display  EKG   Radiology No results found.  Procedures Procedures (including critical care time)  Medications Ordered in UC Medications - No data to display  Initial Impression / Assessment and Plan / UC Course  I have reviewed the triage vital signs and the nursing notes.  Pertinent labs & imaging results that were available during my care of the patient were reviewed by me and considered in my medical decision making (see chart for details).     Patient is well-appearing, afebrile, nontoxic, nontachycardic.  No indication for x-ray as  patient denies any recent trauma and has no focal bony tenderness.  Concern for paronychia given clinical presentation.  She does not have any  obvious fluid collection or significant fluctuance that would warrant I&D in clinic.  She was encouraged use conservative treatment measures including soaking in warm salt water and over-the-counter analgesia for pain.  She was encouraged to keep the area clean and apply Bactroban  ointment twice daily with dressing changes.  Will start Augmentin  twice daily.  Notification for dose adjustment based on metabolic panel from 05/08/2023 with creatinine of 0.72 and calculated creatinine clearance of 87 mL/min.  We discussed that if her symptoms not improving within a week or if anything worsens or he has increasing redness, swelling, numbness or tingling in the finger, fever, nausea, vomiting she needs to be seen immediately.  Strict turn precautions given.  Excuse note provided.  Final Clinical Impressions(s) / UC Diagnoses   Final diagnoses:  Paronychia of finger of right hand     Discharge Instructions      Start Augmentin  twice daily for 7 days.  Soak the finger in warm salt water and apply Bactroban  ointment twice daily with dressing changes.  Use Tylenol  and ibuprofen  for pain.  If your symptoms not improving within a week please return for reevaluation.  If anything worsens or you have increasing pain, swelling, numbness, fever, nausea, vomiting you need to be seen immediately.     ED Prescriptions     Medication Sig Dispense Auth. Provider   mupirocin  ointment (BACTROBAN ) 2 % Apply 1 Application topically 2 (two) times daily. 22 g Charleton Deyoung K, PA-C   amoxicillin -clavulanate (AUGMENTIN ) 875-125 MG tablet Take 1 tablet by mouth every 12 (twelve) hours. 14 tablet Jennye Runquist K, PA-C      PDMP not reviewed this encounter.   Sherrell Rocky POUR, PA-C 08/02/23 1918

## 2023-08-02 NOTE — Discharge Instructions (Signed)
 Start Augmentin  twice daily for 7 days.  Soak the finger in warm salt water and apply Bactroban  ointment twice daily with dressing changes.  Use Tylenol  and ibuprofen  for pain.  If your symptoms not improving within a week please return for reevaluation.  If anything worsens or you have increasing pain, swelling, numbness, fever, nausea, vomiting you need to be seen immediately.

## 2023-08-02 NOTE — ED Triage Notes (Signed)
 States sustained a poke to right middle finger cuticle when she was digging in jewelry box approx 1 wk ago; states area started with swelling and tenderness approx 4 days ago.

## 2023-09-19 ENCOUNTER — Ambulatory Visit: Admitting: Family Medicine

## 2023-11-29 ENCOUNTER — Telehealth: Payer: Self-pay | Admitting: Family

## 2023-11-29 ENCOUNTER — Encounter: Admitting: Family

## 2023-11-29 NOTE — Telephone Encounter (Signed)
 Called patient about missed appt for 11/11. No answer, left message

## 2023-11-29 NOTE — Progress Notes (Signed)
 Erroneous encounter-disregard

## 2023-12-04 ENCOUNTER — Ambulatory Visit (INDEPENDENT_AMBULATORY_CARE_PROVIDER_SITE_OTHER): Payer: Self-pay | Admitting: Family

## 2023-12-04 ENCOUNTER — Encounter: Payer: Self-pay | Admitting: Family

## 2023-12-04 VITALS — BP 126/79 | HR 65 | Temp 98.5°F | Resp 16 | Ht 71.5 in | Wt 145.2 lb

## 2023-12-04 DIAGNOSIS — M1990 Unspecified osteoarthritis, unspecified site: Secondary | ICD-10-CM | POA: Diagnosis not present

## 2023-12-04 DIAGNOSIS — Z7689 Persons encountering health services in other specified circumstances: Secondary | ICD-10-CM

## 2023-12-04 NOTE — Progress Notes (Signed)
 Subjective:    Lindsey Simpson - 58 y.o. female MRN [id number]  Date of birth: 06-02-1965  HPI  Lindsey Simpson is to establish care.   Current issues and/or concerns: - States several years ago at Urgent Care she was diagnosed with arthritis of her entire body. Denies recent trauma/injury and red flag symptoms. States she is taking prescribed medication (states unsure of name) that helps some. Would like referral to specialist.  ROS per HPI   Health Maintenance:  Health Maintenance Due  Topic Date Due   Hepatitis C Screening  Never done   Mammogram  Never done   Colonoscopy  Never done   Cervical Cancer Screening (HPV/Pap Cotest)  02/05/2017     Past Medical History: Patient Active Problem List   Diagnosis Date Noted   Musculoskeletal pain 08/12/2022   Acute right-sided back pain 08/12/2022   Bite, insect 10/15/2021   Fibroids, submucosal 02/05/2014   Symptomatic anemia 12/22/2013   Vaginal bleeding 12/22/2013   Hypotension 12/22/2013   UTI (urinary tract infection) 12/22/2013      Social History   reports that she has never smoked. She has never used smokeless tobacco. She reports current alcohol use. She reports that she does not use drugs.   Family History  family history includes Cancer in her father and mother; Diabetes in her father.   Medications: reviewed and updated   Objective:   Physical Exam BP 126/79   Pulse 65   Temp 98.5 F (36.9 C) (Oral)   Resp 16   Ht 5' 11.5 (1.816 m)   Wt 145 lb 3.2 oz (65.9 kg)   LMP 01/06/2015   SpO2 95%   BMI 19.97 kg/m   Physical Exam HENT:     Head: Normocephalic and atraumatic.     Nose: Nose normal.     Mouth/Throat:     Mouth: Mucous membranes are moist.     Pharynx: Oropharynx is clear.  Eyes:     Extraocular Movements: Extraocular movements intact.     Conjunctiva/sclera: Conjunctivae normal.     Pupils: Pupils are equal, round, and reactive to light.  Cardiovascular:     Rate and Rhythm:  Normal rate and regular rhythm.     Pulses: Normal pulses.     Heart sounds: Normal heart sounds.  Pulmonary:     Effort: Pulmonary effort is normal.     Breath sounds: Normal breath sounds.  Musculoskeletal:        General: Normal range of motion.     Right shoulder: Normal.     Left shoulder: Normal.     Right upper arm: Normal.     Left upper arm: Normal.     Right elbow: Normal.     Left elbow: Normal.     Right forearm: Normal.     Left forearm: Normal.     Right wrist: Normal.     Left wrist: Normal.     Right hand: Normal.     Left hand: Normal.     Cervical back: Normal, normal range of motion and neck supple.     Thoracic back: Normal.     Lumbar back: Normal.     Right hip: Normal.     Left hip: Normal.     Right upper leg: Normal.     Left upper leg: Normal.     Right knee: Normal.     Left knee: Normal.     Right lower leg: Normal.  Left lower leg: Normal.     Right ankle: Normal.     Left ankle: Normal.     Right foot: Normal.     Left foot: Normal.  Neurological:     General: No focal deficit present.     Mental Status: She is alert and oriented to person, place, and time.  Psychiatric:        Mood and Affect: Mood normal.        Behavior: Behavior normal.    Assessment & Plan:  1. Encounter to establish care (Primary) - Patient presents today to establish care. During the interim follow-up with primary provider as scheduled.  - Return for annual physical examination, labs, and health maintenance. Arrive fasting meaning having no food for at least 8 hours prior to appointment. You may have only water or black coffee. Please take scheduled medications as normal.  2. Arthritis - Continue present management.  - Patient states when she returns home she plans to call Primary Care and give update on name of medication she is taking to help with arthritic pain.  - Referral to Orthopedic Surgery for evaluation/management.  - Follow-up with primary provider  as scheduled.  - Ambulatory referral to Orthopedic Surgery   Patient was given clear instructions to go to Emergency Department or return to medical center if symptoms don't improve, worsen, or new problems develop.The patient verbalized understanding.  I discussed the assessment and treatment plan with the patient. The patient was provided an opportunity to ask questions and all were answered. The patient agreed with the plan and demonstrated an understanding of the instructions.   The patient was advised to call back or seek an in-person evaluation if the symptoms worsen or if the condition fails to improve as anticipated.    Greig Drones, NP 12/04/2023, 11:07 AM Primary Care at [facility] ([place])

## 2023-12-04 NOTE — Progress Notes (Signed)
 Patient wants to talk about her arthritis
